# Patient Record
Sex: Female | Born: 1975
Health system: Southern US, Community
[De-identification: ages and names within clinical notes are randomized; demographics above are authoritative.]

## PROBLEM LIST (undated history)

## (undated) DIAGNOSIS — K900 Celiac disease: Secondary | ICD-10-CM

## (undated) DIAGNOSIS — D689 Coagulation defect, unspecified: Secondary | ICD-10-CM

## (undated) DIAGNOSIS — K219 Gastro-esophageal reflux disease without esophagitis: Secondary | ICD-10-CM

## (undated) DIAGNOSIS — M1990 Unspecified osteoarthritis, unspecified site: Secondary | ICD-10-CM

## (undated) DIAGNOSIS — K9041 Non-celiac gluten sensitivity: Secondary | ICD-10-CM

## (undated) DIAGNOSIS — N200 Calculus of kidney: Secondary | ICD-10-CM

## (undated) DIAGNOSIS — K3184 Gastroparesis: Secondary | ICD-10-CM

## (undated) DIAGNOSIS — J342 Deviated nasal septum: Secondary | ICD-10-CM

## (undated) HISTORY — DX: Celiac disease: K90.0

## (undated) HISTORY — PX: CHOLECYSTECTOMY: SHX55

## (undated) HISTORY — DX: Gastro-esophageal reflux disease without esophagitis: K21.9

## (undated) HISTORY — PX: WISDOM TOOTH EXTRACTION: SHX21

## (undated) HISTORY — DX: Deviated nasal septum: J34.2

## (undated) HISTORY — DX: Non-celiac gluten sensitivity: K90.41

## (undated) HISTORY — PX: MASTOIDECTOMY: SHX711

## (undated) HISTORY — DX: Unspecified osteoarthritis, unspecified site: M19.90

## (undated) HISTORY — DX: Coagulation defect, unspecified: D68.9

## (undated) HISTORY — PX: OTHER SURGICAL HISTORY: SHX169

## (undated) HISTORY — DX: Gastroparesis: K31.84

## (undated) HISTORY — DX: Calculus of kidney: N20.0

---

## 2017-08-04 HISTORY — PX: COLONOSCOPY: SHX174

## 2017-08-04 HISTORY — PX: POLYPECTOMY: SHX149

## 2017-12-24 ENCOUNTER — Encounter: Payer: Self-pay | Admitting: Gastroenterology

## 2018-02-23 ENCOUNTER — Telehealth: Payer: Self-pay | Admitting: Gastroenterology

## 2018-02-23 NOTE — Telephone Encounter (Signed)
ROI fax to [facility] for records.

## 2018-02-24 NOTE — Telephone Encounter (Signed)
rec'd from [facility] forwarded 17 pages to Dr. Harl Bowie

## 2018-02-26 ENCOUNTER — Ambulatory Visit: Payer: Managed Care, Other (non HMO) | Admitting: Gastroenterology

## 2018-02-26 ENCOUNTER — Other Ambulatory Visit: Payer: Managed Care, Other (non HMO)

## 2018-02-26 ENCOUNTER — Encounter: Payer: Self-pay | Admitting: *Deleted

## 2018-02-26 VITALS — BP 102/64 | HR 63 | Ht 64.5 in | Wt 136.0 lb

## 2018-02-26 DIAGNOSIS — K625 Hemorrhage of anus and rectum: Secondary | ICD-10-CM | POA: Diagnosis not present

## 2018-02-26 DIAGNOSIS — K582 Mixed irritable bowel syndrome: Secondary | ICD-10-CM

## 2018-02-26 DIAGNOSIS — R140 Abdominal distension (gaseous): Secondary | ICD-10-CM

## 2018-02-26 DIAGNOSIS — K219 Gastro-esophageal reflux disease without esophagitis: Secondary | ICD-10-CM

## 2018-02-26 DIAGNOSIS — E739 Lactose intolerance, unspecified: Secondary | ICD-10-CM

## 2018-02-26 DIAGNOSIS — K900 Celiac disease: Secondary | ICD-10-CM

## 2018-02-26 DIAGNOSIS — K589 Irritable bowel syndrome without diarrhea: Secondary | ICD-10-CM

## 2018-02-26 MED ORDER — NA SULFATE-K SULFATE-MG SULF 17.5-3.13-1.6 GM/177ML PO SOLN: ORAL | 0 refills | Status: DC

## 2018-02-26 NOTE — Progress Notes (Signed)
Julie Meyer    [id number]    [birth year]  Primary Care Physician:Taavon, Delfino Lovett, MD  Referring Physician: No referring provider defined for this encounter.  Chief complaint: Constipation, blood in stool, bright red blood per rectum, GERD, irritable bowel syndrome  HPI: 42 year old female with history of chronic irritable bowel syndrome previously followed by [facility] is here to establish care.  Patiently recently moved to [place].  She has had extensive GI work-up including celiac panel showed elevated Deaminated gliadin antibody to IgA 10.5 (Normal <0.2), EGD with duodenal biopsies was normal.  CRP less than 0.5.  Had allergen food panel IgE level negative.  She was empirically treated for small intestinal bacterial overgrowth, negative fructose hydrogen breath test, developed oral thrush, treated with Diflucan with no significant improvement.  She was given clotrimazole by her dentist with improvement of symptoms.  Gastric emptying scan November 16, 2017 showed normal emptying at 4 hours (emptying was 22% at 1 hour, 38% 2 hours and 91% at 4 hours)  Patient states she was constipated 2 month ago, currently on Miralax.  She was having terrible gas this week, consumed more lactose/dairy products.  Also complaining of lower abdominal pain last month but currently has no pain. Patient's symptoms are all over the place and she moves from one topic to another, extremely anxious and states she is under tremendous stress.  Chronic GERD, reflux is better  Rectal bleeding intermittent for past 1-2 months, small volume BRBPR  Colonoscopy about 10 years ago, was normal per patient report is not available  Maternal aunt diagnosed with colon cancer  Intussustion and cholecystectomy last year  Outpatient Encounter Medications as of 02/26/2018  Medication Sig  . Al Hyd-Mg Tr-Alg Ac-Sod Bicarb (GAVISCON-2 PO) Take 1 capsule by mouth as needed.  . Lactobacillus  Rhamnosus, GG, (CULTURELLE PO) Take 1 capsule by mouth daily.  Marland Kitchen omeprazole (PRILOSEC) 20 MG capsule Take 20 mg by mouth 2 (two) times daily before a meal.  . polyethylene glycol (MIRALAX / GLYCOLAX) packet Take 17 g by mouth daily.   No facility-administered encounter medications on file as of 02/26/2018.     Allergies as of 02/26/2018 - Review Complete 02/26/2018  Allergen Reaction Noted  . Estrogens  02/26/2018    Past Medical History:  Diagnosis Date  . Arthritis   . Gastroparesis   . GERD (gastroesophageal reflux disease)   . Nephrolithiasis     Past Surgical History:  Procedure Laterality Date  . CHOLECYSTECTOMY    . ear reconstructive     age 59  . MASTOIDECTOMY     age 33    Family History  Problem Relation Age of Onset  . Diabetes Father   . Uterine cancer Maternal Grandmother   . Colon cancer Maternal Aunt     Social History   Socioeconomic History  . Marital status: Married    Spouse name: Not on file  . Number of children: 2  . Years of education: Not on file  . Highest education level: Not on file  Occupational History  . Occupation: Pharmacist, hospital  Social Needs  . Financial resource strain: Not on file  . Food insecurity:    Worry: Not on file    Inability: Not on file  . Transportation needs:    Medical: Not on file    Non-medical: Not on file  Tobacco Use  . Smoking status: Never Smoker  . Smokeless tobacco: Never Used  Substance and Sexual Activity  . Alcohol use: Yes  . Drug use: Never  . Sexual activity: Not on file  Lifestyle  . Physical activity:    Days per week: Not on file    Minutes per session: Not on file  . Stress: Not on file  Relationships  . Social connections:    Talks on phone: Not on file    Gets together: Not on file    Attends religious service: Not on file    Active member of club or organization: Not on file    Attends meetings of clubs or organizations: Not on file    Relationship status: Not on file  . Intimate  partner violence:    Fear of current or ex partner: Not on file    Emotionally abused: Not on file    Physically abused: Not on file    Forced sexual activity: Not on file  Other Topics Concern  . Not on file  Social History Narrative  . Not on file      Review of systems: Review of Systems  Constitutional: Negative for fever and chills.  HENT: Positive for sore throat Eyes: Negative for blurred vision.  Respiratory: Negative for cough, shortness of breath and wheezing.   Cardiovascular: Negative for chest pain and palpitations.  Gastrointestinal: as per HPI Genitourinary: Negative for dysuria, urgency, frequency and hematuria.  Musculoskeletal: Negative for myalgias, back pain and joint pain.  Skin: Negative for itching and rash.  Neurological: Negative for dizziness, tremors, focal weakness, seizures and loss of consciousness.  Endo/Heme/Allergies: Negative for seasonal allergies.  Psychiatric/Behavioral: Negative for depression, suicidal ideas and hallucinations.  All other systems reviewed and are negative.   Physical Exam: Vitals:   02/26/18 0932  BP: 102/64  Pulse: 63   Body mass index is 22.98 kg/m. Gen:      No acute distress HEENT:  EOMI, sclera anicteric Neck:     No masses; no thyromegaly Lungs:    Clear to auscultation bilaterally; normal respiratory effort CV:         Regular rate and rhythm; no murmurs Abd:      + bowel sounds; soft, non-tender; no palpable masses, no distension Ext:    No edema; adequate peripheral perfusion Skin:      Warm and dry; no rash Neuro: alert and oriented x 3 Psych: normal mood and affect  Data Reviewed:  Reviewed labs, radiology imaging, old records and pertinent past GI work up   Assessment and Plan/Recommendations:  42 year old female with history of GERD and chronic irritable bowel syndrome here with complaints of small-volume bright red blood per rectum Most likely etiology of rectal bleeding is internal  hemorrhoids but cannot exclude neoplastic lesion Patient is extremely anxious We will proceed with colonoscopy for evaluation The risks and benefits as well as alternatives of endoscopic procedure(s) have been discussed and reviewed. All questions answered. The patient agrees to proceed.  GERD: Stable symptoms Continue antireflux measures Omeprazole 20 mg daily, 30 minutes before breakfast  Abdominal bloating and cramping Advised lactose-free diet Also provided information regarding low FODMAP diet  Irritable bowel syndrome with alternating constipation and diarrhea Discussed the dietary modifications Increase fluid intake Probiotic align 1 capsule daily  Celiac disease: Patient had elevated antibody to anti-gliadin antibody IgA but small bowel biopsies were negative We will recheck celiac panel also request HLA DQ 2 DQ 8 testing She is currently not restricting gluten in her diet. Discussed with patient in detail that if she  does have celiac then she needs to strictly adhere to gluten-free diet  No evidence of gastroparesis based on gastric emptying scan, no further testing is recommended.   Damaris Hippo , MD [phone]    CC: No ref. provider found

## 2018-02-26 NOTE — Patient Instructions (Addendum)
You have been scheduled for a colonoscopy. Please follow written instructions given to you at your visit today.  Please pick up your prep supplies at the pharmacy within the next 1-3 days. If you use inhalers (even only as needed), please bring them with you on the day of your procedure. Your physician has requested that you go to www.startemmi.com and enter the access code given to you at your visit today. This web site gives a general overview about your procedure. However, you should still follow specific instructions given to you by our office regarding your preparation for the procedure.  Go to the basement for labs today  [email or web address]  Take 1 Align capsule daily  Use IBGard 1 capsule three times a day as needed  Thank you for choosing [facility]  Kavitha Nandigam,MD

## 2018-03-01 ENCOUNTER — Encounter: Payer: Self-pay | Admitting: Gastroenterology

## 2018-03-03 LAB — GLIADIN ANTIBODIES, SERUM
Gliadin IgA: 5 Units
Gliadin IgG: 2 Units

## 2018-03-03 LAB — TISSUE TRANSGLUTAMINASE, IGA: (tTG) Ab, IgA: 1 U/mL

## 2018-03-03 LAB — CELIAC DISEASE HLA DQ ASSOC.
DQ2 (DQA1 0501/0505,DQB1 02XX): POSITIVE
DQ8 (DQA1 03XX, DQB1 0302): NEGATIVE

## 2018-03-03 LAB — RETICULIN ANTIBODIES, IGA W TITER: Reticulin IGA Screen: NEGATIVE

## 2018-03-05 ENCOUNTER — Telehealth: Payer: Self-pay | Admitting: Gastroenterology

## 2018-03-05 NOTE — Telephone Encounter (Signed)
I spoke with her.  She is concerned about what might happen if she "occasionally" had a gluten food.  She had not been gluten free up till this point so she should be fine.

## 2018-03-12 ENCOUNTER — Encounter: Payer: Self-pay | Admitting: Gastroenterology

## 2018-03-12 ENCOUNTER — Ambulatory Visit (AMBULATORY_SURGERY_CENTER): Payer: Managed Care, Other (non HMO) | Admitting: Gastroenterology

## 2018-03-12 VITALS — BP 107/68 | HR 69 | Temp 99.3°F | Resp 24 | Ht 64.0 in | Wt 136.0 lb

## 2018-03-12 DIAGNOSIS — D122 Benign neoplasm of ascending colon: Secondary | ICD-10-CM | POA: Diagnosis present

## 2018-03-12 DIAGNOSIS — K625 Hemorrhage of anus and rectum: Secondary | ICD-10-CM

## 2018-03-12 DIAGNOSIS — K648 Other hemorrhoids: Secondary | ICD-10-CM | POA: Diagnosis not present

## 2018-03-12 DIAGNOSIS — D123 Benign neoplasm of transverse colon: Secondary | ICD-10-CM

## 2018-03-12 MED ORDER — SODIUM CHLORIDE 0.9 % IV SOLN: 500.0000 mL | Freq: Once | INTRAVENOUS | Status: DC

## 2018-03-12 NOTE — Patient Instructions (Signed)
  Please read handouts on polyps and hemorrhoids. Continue present medications. Return to GI clinic as needed.    YOU HAD AN ENDOSCOPIC PROCEDURE TODAY AT [facility]:   Refer to the procedure report that was given to you for any specific questions about what was found during the examination.  If the procedure report does not answer your questions, please call your gastroenterologist to clarify.  If you requested that your care partner not be given the details of your procedure findings, then the procedure report has been included in a sealed envelope for you to review at your convenience later.  YOU SHOULD EXPECT: Some feelings of bloating in the abdomen. Passage of more gas than usual.  Walking can help get rid of the air that was put into your GI tract during the procedure and reduce the bloating. If you had a lower endoscopy (such as a colonoscopy or flexible sigmoidoscopy) you may notice spotting of blood in your stool or on the toilet paper. If you underwent a bowel prep for your procedure, you may not have a normal bowel movement for a few days.  Please Note:  You might notice some irritation and congestion in your nose or some drainage.  This is from the oxygen used during your procedure.  There is no need for concern and it should clear up in a day or so.  SYMPTOMS TO REPORT IMMEDIATELY:   Following lower endoscopy (colonoscopy or flexible sigmoidoscopy):  Excessive amounts of blood in the stool  Significant tenderness or worsening of abdominal pains  Swelling of the abdomen that is new, acute  Fever of 100F or higher   For urgent or emergent issues, a gastroenterologist can be reached at any hour by calling [phone].   DIET:  We do recommend a small meal at first, but then you may proceed to your regular diet.  Drink plenty of fluids but you should avoid alcoholic beverages for 24 hours.  ACTIVITY:  You should plan to take it easy for the rest of today and  you should NOT DRIVE or use heavy machinery until tomorrow (because of the sedation medicines used during the test).    FOLLOW UP: Our staff will call the number listed on your records the next business day following your procedure to check on you and address any questions or concerns that you may have regarding the information given to you following your procedure. If we do not reach you, we will leave a message.  However, if you are feeling well and you are not experiencing any problems, there is no need to return our call.  We will assume that you have returned to your regular daily activities without incident.  If any biopsies were taken you will be contacted by phone or by letter within the next 1-3 weeks.  Please call us at [phone] if you have not heard about the biopsies in 3 weeks.    SIGNATURES/CONFIDENTIALITY: You and/or your care partner have signed paperwork which will be entered into your electronic medical record.  These signatures attest to the fact that that the information above on your After Visit Summary has been reviewed and is understood.  Full responsibility of the confidentiality of this discharge information lies with you and/or your care-partner.

## 2018-03-12 NOTE — Progress Notes (Signed)
Called to room to assist during endoscopic procedure.  Patient ID and intended procedure confirmed with present staff. Received instructions for my participation in the procedure from the performing physician.  

## 2018-03-12 NOTE — Op Note (Signed)
[facility] Patient Name: Julie Meyer Procedure Date: 03/12/2018 10:54 AM MRN: [id number] Endoscopist: Mauri Pole , MD Age: 43 Referring MD:  Date of Birth: Jan 06, 1976 Gender: Female Account #: [id number] Procedure:                Colonoscopy Indications:              Evaluation of unexplained GI bleeding Medicines:                Monitored Anesthesia Care Procedure:                Pre-Anesthesia Assessment:                           - Prior to the procedure, a History and Physical                            was performed, and patient medications and                            allergies were reviewed. The patient's tolerance of                            previous anesthesia was also reviewed. The risks                            and benefits of the procedure and the sedation                            options and risks were discussed with the patient.                            All questions were answered, and informed consent                            was obtained. Prior Anticoagulants: The patient has                            taken no previous anticoagulant or antiplatelet                            agents. ASA Grade Assessment: II - A patient with                            mild systemic disease. After reviewing the risks                            and benefits, the patient was deemed in                            satisfactory condition to undergo the procedure.                           After obtaining informed consent, the colonoscope  was passed under direct vision. Throughout the                            procedure, the patient's blood pressure, pulse, and                            oxygen saturations were monitored continuously. The                            Model PCF-[id number]) scope was introduced                            through the anus and advanced to the the cecum,                            identified by  appendiceal orifice and ileocecal                            valve. The colonoscopy was performed without                            difficulty. The patient tolerated the procedure                            well. The quality of the bowel preparation was                            excellent. The ileocecal valve, appendiceal                            orifice, and rectum were photographed. Scope In: 11:06:41 AM Scope Out: 11:21:52 AM Scope Withdrawal Time: 0 hours 9 minutes 3 seconds  Total Procedure Duration: 0 hours 15 minutes 11 seconds  Findings:                 The perianal and digital rectal examinations were                            normal.                           A 9 mm polyp was found in the ascending colon. The                            polyp was flat. The polyp was removed with a cold                            snare. Resection and retrieval were complete.                           A 1 mm polyp was found in the transverse colon. The                            polyp was sessile. The polyp was removed  with a                            cold biopsy forceps. Resection and retrieval were                            complete.                           Non-bleeding internal hemorrhoids were found during                            retroflexion. The hemorrhoids were medium-sized.                           The exam was otherwise without abnormality. Complications:            No immediate complications. Estimated Blood Loss:     Estimated blood loss was minimal. Impression:               - One 9 mm polyp in the ascending colon, removed                            with a cold snare. Resected and retrieved.                           - One 1 mm polyp in the transverse colon, removed                            with a cold biopsy forceps. Resected and retrieved.                           - Non-bleeding internal hemorrhoids.                           - The examination was otherwise  normal. Recommendation:           - Patient has a contact number available for                            emergencies. The signs and symptoms of potential                            delayed complications were discussed with the                            patient. Return to normal activities tomorrow.                            Written discharge instructions were provided to the                            patient.                           - Resume previous diet.                           -  Continue present medications.                           - Await pathology results.                           - Repeat colonoscopy date to be determined after                            pending pathology results are reviewed for                            surveillance based on pathology results.                           - Return to GI clinic PRN. Mauri Pole, MD 03/12/2018 11:29:06 AM This report has been signed electronically.

## 2018-03-15 ENCOUNTER — Telehealth: Payer: Self-pay

## 2018-03-15 NOTE — Telephone Encounter (Signed)
  Follow up Call-  Call back number 03/12/2018  Post procedure Call Back phone  # [phone]  Permission to leave phone message Yes     Patient questions:  Do you have a fever, pain , or abdominal swelling? No. Pain Score  0 *  Have you tolerated food without any problems? Yes.    Have you been able to return to your normal activities? Yes.    Do you have any questions about your discharge instructions: Diet   No. Medications  No. Follow up visit  No.  Do you have questions or concerns about your Care? No.  Actions: * If pain score is 4 or above: No action needed, pain <4.

## 2018-03-17 ENCOUNTER — Other Ambulatory Visit: Payer: Self-pay

## 2018-03-18 ENCOUNTER — Encounter: Payer: Self-pay | Admitting: Gastroenterology

## 2018-03-25 ENCOUNTER — Telehealth: Payer: Self-pay | Admitting: Gastroenterology

## 2018-03-25 NOTE — Telephone Encounter (Signed)
Patient states Dr.Nandigam put her on a gluten free diet but her acid reflux medication omeprazole has gluten in it. Patient is wanting to know if there is some form of this medicine for a similar one that is gluten free.

## 2018-03-25 NOTE — Telephone Encounter (Signed)
Can you please check if alternative PPI, Protonix or lansoprazole are gluten-free or not.  Will consider switching to ranitidine 150 mg twice daily if all PPI have gluten in the formulary

## 2018-03-25 NOTE — Telephone Encounter (Signed)
Left a message for the patient.

## 2018-04-07 NOTE — Telephone Encounter (Signed)
Discussed with the patient. She has not had any GI distress while continuing to take Omeprazole. She chooses to continue it at this time. Also states she has been on Ranitidine in the past and did not find it as effective as Omeprazole.

## 2018-05-25 ENCOUNTER — Encounter: Payer: Self-pay | Admitting: Gastroenterology

## 2018-05-25 ENCOUNTER — Ambulatory Visit: Payer: Managed Care, Other (non HMO) | Admitting: Gastroenterology

## 2018-05-25 VITALS — BP 90/64 | HR 68 | Ht 64.0 in | Wt 143.6 lb

## 2018-05-25 DIAGNOSIS — K5909 Other constipation: Secondary | ICD-10-CM | POA: Diagnosis not present

## 2018-05-25 DIAGNOSIS — R1084 Generalized abdominal pain: Secondary | ICD-10-CM

## 2018-05-25 DIAGNOSIS — K900 Celiac disease: Secondary | ICD-10-CM | POA: Diagnosis not present

## 2018-05-25 DIAGNOSIS — R109 Unspecified abdominal pain: Secondary | ICD-10-CM

## 2018-05-25 DIAGNOSIS — R140 Abdominal distension (gaseous): Secondary | ICD-10-CM | POA: Diagnosis not present

## 2018-05-25 NOTE — Patient Instructions (Signed)
You have been given a testing kit Glucose breath test, which is completed by a company named [facility]. Make sure to return your test in the mail using the return mailing label given you along with the kit. Your demographic and insurance information have already been sent to the company and they should be in contact with you over the next week regarding this test. Please keep in mind that you will be getting a call from phone number [phone] or a similar number. If you do not hear from them within this time frame, please call our office at [phone].   Continue Miralax 1 capsful daily and titrate as needed  Take IBGard 1 capsule three times a day as needed    Gluten-Free Diet for Celiac Disease, Adult The gluten-free diet includes all foods that do not contain gluten. Gluten is a protein that is found in wheat, rye, barley, and some other grains. Following the gluten-free diet is the only treatment for people with celiac disease. It helps to prevent damage to the intestines and improves or eliminates the symptoms of celiac disease. Following the gluten-free diet requires some planning. It can be challenging at first, but it gets easier with time and practice. There are more gluten-free options available today than ever before. If you need help finding gluten-free foods or if you have questions, talk with your diet and nutrition specialist (registered dietitian) or your health care provider. What do I need to know about a gluten-free diet?  All fruits, vegetables, and meats are safe to eat and do not contain gluten.  When grocery shopping, start by shopping in the produce, meat, and dairy sections. These sections are more likely to contain gluten-free foods. Then move to the aisles that contain packaged foods if you need to.  Read all food labels. Gluten is often added to foods. Always check the ingredient list and look for warnings, such as "may contain gluten."  Talk with your  dietitian or health care provider before taking a gluten-free multivitamin or mineral supplement.  Be aware of gluten-free foods having contact with foods that contain gluten (cross-contamination). This can happen at home and with any processed foods. ? Talk with your health care provider or dietitian about how to reduce the risk of cross-contamination in your home. ? If you have questions about how a food is processed, ask the manufacturer. What key words help to identify gluten? Foods that list any of these key words on the label usually contain gluten:  Wheat, flour, enriched flour, bromated flour, white flour, durum flour, graham flour, phosphated flour, self-rising flour, semolina, farina, barley (malt), rye, and oats.  Starch, dextrin, modified food starch, or cereal.  Thickening, fillers, or emulsifiers.  Malt flavoring, malt extract, or malt syrup.  Hydrolyzed vegetable protein.  In the [place] packaged foods that are gluten-free are required to be labeled "GF." These foods should be easy to identify and are safe to eat. In the [place] food companies are also required to list common food allergens, including wheat, on their labels. Recommended foods Grains  Amaranth, bean flours, 100% buckwheat flour, corn, millet, nut flours or nut meals, GF oats, quinoa, rice, sorghum, teff, rice wafers, pure cornmeal tortillas, popcorn, and hot cereals made from cornmeal. Hominy, rice, wild rice. Some Asian rice noodles or bean noodles. Arrowroot starch, corn bran, corn flour, corn germ, cornmeal, corn starch, potato flour, potato starch flour, and rice bran. Plain, brown, and sweet rice flours. Rice polish, soy flour, and tapioca starch.  Vegetables  All plain fresh, frozen, and canned vegetables. Fruits  All plain fresh, frozen, canned, and dried fruits, and 100% fruit juices. Meats and other protein foods  All fresh beef, pork, poultry, fish, seafood, and eggs. Fish canned in water, oil,  brine, or vegetable broth. Plain nuts and seeds, peanut butter. Some lunch meat and some frankfurters. Dried beans, dried peas, and lentils. Dairy  Fresh plain, dry, evaporated, or condensed milk. Cream, butter, sour cream, whipping cream, and most yogurts. Unprocessed cheese, most processed cheeses, some cottage cheese, some cream cheeses. Beverages  Coffee, tea, most herbal teas. Carbonated beverages and some root beers. Wine, sake, and distilled spirits, such as gin, vodka, and whiskey. Most hard ciders. Fats and oils  Butter, margarine, vegetable oil, hydrogenated butter, olive oil, shortening, lard, cream, and some mayonnaise. Some commercial salad dressings. Olives. Sweets and desserts  Sugar, honey, some syrups, molasses, jelly, and jam. Plain hard candy, marshmallows, and gumdrops. Pure cocoa powder. Plain chocolate. Custard and some pudding mixes. Gelatin desserts, sorbets, frozen ice pops, and sherbet. Cake, cookies, and other desserts prepared with allowed flours. Some commercial ice creams. Cornstarch, tapioca, and rice puddings. Seasoning and other foods  Some canned or frozen soups. Monosodium glutamate (MSG). Cider, rice, and wine vinegar. Baking soda and baking powder. Cream of tartar. Baking and nutritional yeast. Certain soy sauces made without wheat (ask your dietitian about specific brands that are allowed). Nuts, coconut, and chocolate. Salt, pepper, herbs, spices, flavoring extracts, imitation or artificial flavorings, natural flavorings, and food colorings. Some medicines and supplements. Some lip glosses and other cosmetics. Rice syrups. The items listed may not be a complete list. Talk with your dietitian about what dietary choices are best for you. Foods to avoid Grains  Barley, bran, bulgur, couscous, cracked wheat, Fifty-Six, farro, graham, malt, matzo, semolina, wheat germ, and all wheat and rye cereals including spelt and kamut. Cereals containing malt as a flavoring,  such as rice cereal. Noodles, spaghetti, macaroni, most packaged rice mixes, and all mixes containing wheat, rye, barley, or triticale. Vegetables  Most creamed vegetables and most vegetables canned in sauces. Some commercially prepared vegetables and salads. Fruits  Thickened or prepared fruits and some pie fillings. Some fruit snacks and fruit roll-ups. Meats and other protein foods  Any meat or meat alternative containing wheat, rye, barley, or gluten stabilizers. These are often marinated or packaged meats and lunch meats. Bread-containing products, such as Swiss steak, croquettes, meatballs, and meatloaf. Most tuna canned in vegetable broth and [place] with hydrolyzed vegetable protein (HVP) injected as part of the basting. Seitan. Imitation fish. Eggs in sauces made from ingredients to avoid. Dairy  Commercial chocolate milk drinks and malted milk. Some non-dairy creamers. Any cheese product containing ingredients to avoid. Beverages  Certain cereal beverages. Beer, ale, malted milk, and some root beers. Some hard ciders. Some instant flavored coffees. Some herbal teas made with barley or with barley malt added. Fats and oils  Some commercial salad dressings. Sour cream containing modified food starch. Sweets and desserts  Some toffees. Chocolate-coated nuts (may be rolled in wheat flour) and some commercial candies and candy bars. Most cakes, cookies, donuts, pastries, and other baked goods. Some commercial ice cream. Ice cream cones. Commercially prepared mixes for cakes, cookies, and other desserts. Bread pudding and other puddings thickened with flour. Products containing brown rice syrup made with barley malt enzyme. Desserts and sweets made with malt flavoring. Seasoning and other foods  Some curry powders, some dry seasoning mixes,  some gravy extracts, some meat sauces, some ketchups, some prepared mustards, and horseradish. Certain soy sauces. Malt vinegar. Bouillon and bouillon  cubes that contain HVP. Some chip dips, and some chewing gum. Yeast extract. Brewer's yeast. Caramel color. Some medicines and supplements. Some lip glosses and other cosmetics. The items listed may not be a complete list. Talk with your dietitian about what dietary choices are best for you. Summary  Gluten is a protein that is found in wheat, rye, barley, and some other grains. The gluten-free diet includes all foods that do not contain gluten.  If you need help finding gluten-free foods or if you have questions, talk with your diet and nutrition specialist (registered dietitian) or your health care provider.  Read all food labels. Gluten is often added to foods. Always check the ingredient list and look for warnings, such as "may contain gluten." This information is not intended to replace advice given to you by your health care provider. Make sure you discuss any questions you have with your health care provider. Document Released: 07/21/2005 Document Revised: 05/05/2016 Document Reviewed: 05/05/2016 Elsevier Interactive Patient Education  2018 [employer].

## 2018-05-25 NOTE — Progress Notes (Addendum)
Julie Meyer    [id number]    10-28-1975  Primary Care Physician:Taavon, Delfino Lovett, MD  Referring Physician: Brien Few, MD [place], [place] [address]  Chief complaint: Bloating, lower abdominal cramps, constipation  HPI: 42 year old Caucasian female with history of chronic irritable bowel syndrome, chronic abdominal bloating associated with lower abdominal cramps She is having irregular bowel habits with bowel movement once every 3 to 4 days.  Recently restarted taking MiraLAX daily Her mother has endometriosis and she was informed by her gynecologist that it is possible she may have endometriosis, may need ex lap but patient wants to hold off at this point.  Elevated deaminated gliadin antibody to IgA in [place] repeat celiac panel negative here but HLA DQ2 positive. She is trying to follow a gluten-free diet.  She reports improvement of skin rash and oral ulcers since she stopped eating gluten.  Had severe lower abdominal cramps after she ate pecans.  Is also trying to avoid high FODMAPs.  She is no longer having rectal bleeding, trying to avoid excessive straining during defecation. Colonoscopy March 12, 2018 removal of sessile serrated adenoma and tubular adenoma.  Internal hemorrhoids otherwise normal exam   Outpatient Encounter Medications as of 05/25/2018  Medication Sig  . Al Hyd-Mg Tr-Alg Ac-Sod Bicarb (GAVISCON-2 PO) Take 1 capsule by mouth as needed.  . Lactobacillus Rhamnosus, GG, (CULTURELLE PO) Take 1 capsule by mouth daily.  Marland Kitchen omeprazole (PRILOSEC) 20 MG capsule Take 20 mg by mouth 2 (two) times daily before a meal.  . Peppermint Oil (IBGARD) 90 MG CPCR Take 2 capsules by mouth daily.  . polyethylene glycol (MIRALAX / GLYCOLAX) packet Take 17 g by mouth daily.   No facility-administered encounter medications on file as of 05/25/2018.     Allergies as of 05/25/2018 - Review Complete 05/25/2018  Allergen Reaction Noted    . Estrogens  02/26/2018    Past Medical History:  Diagnosis Date  . Arthritis   . Clotting disorder ([place])    Has a venous malformation in left foot which causes superficial clots  . Gastroparesis   . GERD (gastroesophageal reflux disease)   . Nephrolithiasis     Past Surgical History:  Procedure Laterality Date  . CHOLECYSTECTOMY    . ear reconstructive     age 29  . MASTOIDECTOMY     age 66    Family History  Problem Relation Age of Onset  . Diabetes Father   . Uterine cancer Maternal Grandmother   . Colon cancer Maternal Aunt     Social History   Socioeconomic History  . Marital status: Married    Spouse name: Not on file  . Number of children: 2  . Years of education: Not on file  . Highest education level: Not on file  Occupational History  . Occupation: Pharmacist, hospital  Social Needs  . Financial resource strain: Not on file  . Food insecurity:    Worry: Not on file    Inability: Not on file  . Transportation needs:    Medical: Not on file    Non-medical: Not on file  Tobacco Use  . Smoking status: Never Smoker  . Smokeless tobacco: Never Used  Substance and Sexual Activity  . Alcohol use: Yes  . Drug use: Never  . Sexual activity: Not on file  Lifestyle  . Physical activity:    Days per week: Not on file    Minutes per session:  Not on file  . Stress: Not on file  Relationships  . Social connections:    Talks on phone: Not on file    Gets together: Not on file    Attends religious service: Not on file    Active member of club or organization: Not on file    Attends meetings of clubs or organizations: Not on file    Relationship status: Not on file  . Intimate partner violence:    Fear of current or ex partner: Not on file    Emotionally abused: Not on file    Physically abused: Not on file    Forced sexual activity: Not on file  Other Topics Concern  . Not on file  Social History Narrative  . Not on file      Review of systems: Review of  Systems  Constitutional: Negative for fever and chills.  HENT: Negative.   Eyes: Negative for blurred vision.  Respiratory: Negative for cough, shortness of breath and wheezing.   Cardiovascular: Negative for chest pain and palpitations.  Gastrointestinal: as per HPI Genitourinary: Negative for dysuria, urgency, frequency and hematuria.  Musculoskeletal: Negative for myalgias, back pain and joint pain.  Skin: Negative for itching and rash.  Neurological: Negative for dizziness, tremors, focal weakness, seizures and loss of consciousness.  Endo/Heme/Allergies: Negative for seasonal allergies.  Psychiatric/Behavioral: Negative for depression, suicidal ideas and hallucinations.  All other systems reviewed and are negative.   Physical Exam: Vitals:   05/25/18 0853  BP: 90/64  Pulse: 68   Body mass index is 24.65 kg/m. Gen:      No acute distress HEENT:  EOMI, sclera anicteric Neck:     No masses; no thyromegaly Lungs:    Clear to auscultation bilaterally; normal respiratory effort CV:         Regular rate and rhythm; no murmurs Abd:      + bowel sounds; soft, non-tender; no palpable masses, no distension Ext:    No edema; adequate peripheral perfusion Skin:      Warm and dry; no rash Neuro: alert and oriented x 3 Psych: normal mood and affect  Data Reviewed:  Reviewed labs, radiology imaging, old records and pertinent past GI work up   Assessment and Plan/Recommendations: 42-year-old female with celiac disease, persistent abdominal bloating and cramping despite falling gluten-free diet Advised patient to look for possible cross-contamination during manufacturing process and accidental gluten ingestion.  Continue to follow strictly gluten-free diet  Constipation: Relax 1 capful daily and titrate up as needed to have daily soft BM Increase dietary fiber and fluid intake  Excessive bloating and abdominal cramping: Will request glucose breath test to exclude small intestinal  bacterial overgrowth Use IBgard 1 capsule up to 3 times daily as needed  Greater than 50% of the time used for counseling as well as treatment plan and follow-up. She had multiple questions which were answered to her satisfaction  K. Veena Nandigam , MD [phone]    CC: Taavon, Richard, MD   

## 2018-05-26 ENCOUNTER — Encounter: Payer: Self-pay | Admitting: Gastroenterology

## 2018-06-01 ENCOUNTER — Telehealth: Payer: Self-pay | Admitting: Gastroenterology

## 2018-06-01 NOTE — Telephone Encounter (Signed)
Pt states that she had diarrhea and fever the day after her breath test, they resolved but now she has a terrible upper stomach pain, she wants to know what she can take and if they are related to breath test.

## 2018-06-02 NOTE — Telephone Encounter (Signed)
Ok

## 2018-06-02 NOTE — Telephone Encounter (Signed)
Symptoms have improved. She started with diarrhea and fever after she had done the breath test. This resolved. She then had soreness in her abdomen that was improved with warmth from her heating pad. She has Carafate from previous issues she has been taking. She will let us know if she fails to improve or acutely worsens.  Encouraged to follow the plan as discussed in the office visit.

## 2018-06-04 ENCOUNTER — Telehealth: Payer: Self-pay | Admitting: Gastroenterology

## 2018-06-04 NOTE — Telephone Encounter (Signed)
Lab needs ICD 10 and signature of MD for SIBO order.

## 2018-06-07 NOTE — Telephone Encounter (Signed)
Called back to the lab. ICD-10 given as from the recent office visit. R14.0 /  R10.84 /  R10.9

## 2018-06-08 ENCOUNTER — Telehealth: Payer: Self-pay | Admitting: Gastroenterology

## 2018-06-08 NOTE — Telephone Encounter (Signed)
Done  Sent back with Dr Jillyn Hidden signiture

## 2018-06-08 NOTE — Telephone Encounter (Signed)
The form will be faxed back to [place] for the signature. I think it will come to your fax machine. It is the breath test given to her at the office visit. I have given the ICD-10 codes to them already. Just need the signature.  Thanks

## 2018-06-08 NOTE — Telephone Encounter (Signed)
Raphael from lab calling, he stated that fax that they received is missing MD's signature, pls refax it.

## 2018-06-11 ENCOUNTER — Telehealth: Payer: Self-pay | Admitting: Gastroenterology

## 2018-06-14 NOTE — Telephone Encounter (Signed)
Dr Silverio Decamp will review the results once she receives them.

## 2018-07-16 ENCOUNTER — Ambulatory Visit: Payer: Managed Care, Other (non HMO) | Admitting: Gastroenterology

## 2018-07-16 ENCOUNTER — Encounter: Payer: Self-pay | Admitting: Gastroenterology

## 2018-07-16 VITALS — BP 98/62 | HR 68 | Ht 64.5 in | Wt 142.0 lb

## 2018-07-16 DIAGNOSIS — K219 Gastro-esophageal reflux disease without esophagitis: Secondary | ICD-10-CM | POA: Diagnosis not present

## 2018-07-16 DIAGNOSIS — K900 Celiac disease: Secondary | ICD-10-CM | POA: Diagnosis not present

## 2018-07-16 DIAGNOSIS — K588 Other irritable bowel syndrome: Secondary | ICD-10-CM | POA: Diagnosis not present

## 2018-07-16 NOTE — Patient Instructions (Signed)
We have printed your lab orders for you to carry to your primary care doctor   Continue Omeprazole   Gastroesophageal Reflux Disease, Adult Normally, food travels down the esophagus and stays in the stomach to be digested. However, when a person has gastroesophageal reflux disease (GERD), food and stomach acid move back up into the esophagus. When this happens, the esophagus becomes sore and inflamed. Over time, GERD can create small holes (ulcers) in the lining of the esophagus. What are the causes? This condition is caused by a problem with the muscle between the esophagus and the stomach (lower esophageal sphincter, or LES). Normally, the LES muscle closes after food passes through the esophagus to the stomach. When the LES is weakened or abnormal, it does not close properly, and that allows food and stomach acid to go back up into the esophagus. The LES can be weakened by certain dietary substances, medicines, and medical conditions, including:  Tobacco use.  Pregnancy.  Having a hiatal hernia.  Heavy alcohol use.  Certain foods and beverages, such as coffee, chocolate, onions, and peppermint.  What increases the risk? This condition is more likely to develop in:  People who have an increased body weight.  People who have connective tissue disorders.  People who use NSAID medicines.  What are the signs or symptoms? Symptoms of this condition include:  Heartburn.  Difficult or painful swallowing.  The feeling of having a lump in the throat.  Abitter taste in the mouth.  Bad breath.  Having a large amount of saliva.  Having an upset or bloated stomach.  Belching.  Chest pain.  Shortness of breath or wheezing.  Ongoing (chronic) cough or a night-time cough.  Wearing away of tooth enamel.  Weight loss.  Different conditions can cause chest pain. Make sure to see your health care provider if you experience chest pain. How is this diagnosed? Your health care  provider will take a medical history and perform a physical exam. To determine if you have mild or severe GERD, your health care provider may also monitor how you respond to treatment. You may also have other tests, including:  An endoscopy toexamine your stomach and esophagus with a small camera.  A test thatmeasures the acidity level in your esophagus.  A test thatmeasures how much pressure is on your esophagus.  A barium swallow or modified barium swallow to show the shape, size, and functioning of your esophagus.  How is this treated? The goal of treatment is to help relieve your symptoms and to prevent complications. Treatment for this condition may vary depending on how severe your symptoms are. Your health care provider may recommend:  Changes to your diet.  Medicine.  Surgery.  Follow these instructions at home: Diet  Follow a diet as recommended by your health care provider. This may involve avoiding foods and drinks such as: ? Coffee and tea (with or without caffeine). ? Drinks that containalcohol. ? Energy drinks and sports drinks. ? Carbonated drinks or sodas. ? Chocolate and cocoa. ? Peppermint and mint flavorings. ? Garlic and onions. ? Horseradish. ? Spicy and acidic foods, including peppers, chili powder, curry powder, vinegar, hot sauces, and barbecue sauce. ? Citrus fruit juices and citrus fruits, such as oranges, lemons, and limes. ? Tomato-based foods, such as red sauce, chili, salsa, and pizza with red sauce. ? Fried and fatty foods, such as donuts, french fries, potato chips, and high-fat dressings. ? High-fat meats, such as hot dogs and fatty cuts of  red and white meats, such as rib eye steak, sausage, ham, and bacon. ? High-fat dairy items, such as whole milk, butter, and cream cheese.  Eat small, frequent meals instead of large meals.  Avoid drinking large amounts of liquid with your meals.  Avoid eating meals during the 2-3 hours before  bedtime.  Avoid lying down right after you eat.  Do not exercise right after you eat. General instructions  Pay attention to any changes in your symptoms.  Take over-the-counter and prescription medicines only as told by your health care provider. Do not take aspirin, ibuprofen, or other NSAIDs unless your health care provider told you to do so.  Do not use any tobacco products, including cigarettes, chewing tobacco, and e-cigarettes. If you need help quitting, ask your health care provider.  Wear loose-fitting clothing. Do not wear anything tight around your waist that causes pressure on your abdomen.  Raise (elevate) the head of your bed 6 inches (15cm).  Try to reduce your stress, such as with yoga or meditation. If you need help reducing stress, ask your health care provider.  If you are overweight, reduce your weight to an amount that is healthy for you. Ask your health care provider for guidance about a safe weight loss goal.  Keep all follow-up visits as told by your health care provider. This is important. Contact a health care provider if:  You have new symptoms.  You have unexplained weight loss.  You have difficulty swallowing, or it hurts to swallow.  You have wheezing or a persistent cough.  Your symptoms do not improve with treatment.  You have a hoarse voice. Get help right away if:  You have pain in your arms, neck, jaw, teeth, or back.  You feel sweaty, dizzy, or light-headed.  You have chest pain or shortness of breath.  You vomit and your vomit looks like blood or coffee grounds.  You faint.  Your stool is bloody or black.  You cannot swallow, drink, or eat. This information is not intended to replace advice given to you by your health care provider. Make sure you discuss any questions you have with your health care provider. Document Released: 04/30/2005 Document Revised: 12/19/2015 Document Reviewed: 11/15/2014 Elsevier Interactive Patient  Education  2018 [employer].   Gluten-Free Diet for Celiac Disease, Adult The gluten-free diet includes all foods that do not contain gluten. Gluten is a protein that is found in wheat, rye, barley, and some other grains. Following the gluten-free diet is the only treatment for people with celiac disease. It helps to prevent damage to the intestines and improves or eliminates the symptoms of celiac disease. Following the gluten-free diet requires some planning. It can be challenging at first, but it gets easier with time and practice. There are more gluten-free options available today than ever before. If you need help finding gluten-free foods or if you have questions, talk with your diet and nutrition specialist (registered dietitian) or your health care provider. What do I need to know about a gluten-free diet?  All fruits, vegetables, and meats are safe to eat and do not contain gluten.  When grocery shopping, start by shopping in the produce, meat, and dairy sections. These sections are more likely to contain gluten-free foods. Then move to the aisles that contain packaged foods if you need to.  Read all food labels. Gluten is often added to foods. Always check the ingredient list and look for warnings, such as "may contain gluten."  Talk  with your dietitian or health care provider before taking a gluten-free multivitamin or mineral supplement.  Be aware of gluten-free foods having contact with foods that contain gluten (cross-contamination). This can happen at home and with any processed foods. ? Talk with your health care provider or dietitian about how to reduce the risk of cross-contamination in your home. ? If you have questions about how a food is processed, ask the manufacturer. What key words help to identify gluten? Foods that list any of these key words on the label usually contain gluten:  Wheat, flour, enriched flour, bromated flour, white flour, durum flour, graham flour,  phosphated flour, self-rising flour, semolina, farina, barley (malt), rye, and oats.  Starch, dextrin, modified food starch, or cereal.  Thickening, fillers, or emulsifiers.  Malt flavoring, malt extract, or malt syrup.  Hydrolyzed vegetable protein.  In the [place] packaged foods that are gluten-free are required to be labeled "GF." These foods should be easy to identify and are safe to eat. In the [place] food companies are also required to list common food allergens, including wheat, on their labels. Recommended foods Grains  Amaranth, bean flours, 100% buckwheat flour, corn, millet, nut flours or nut meals, GF oats, quinoa, rice, sorghum, teff, rice wafers, pure cornmeal tortillas, popcorn, and hot cereals made from cornmeal. Hominy, rice, wild rice. Some Asian rice noodles or bean noodles. Arrowroot starch, corn bran, corn flour, corn germ, cornmeal, corn starch, potato flour, potato starch flour, and rice bran. Plain, brown, and sweet rice flours. Rice polish, soy flour, and tapioca starch. Vegetables  All plain fresh, frozen, and canned vegetables. Fruits  All plain fresh, frozen, canned, and dried fruits, and 100% fruit juices. Meats and other protein foods  All fresh beef, pork, poultry, fish, seafood, and eggs. Fish canned in water, oil, brine, or vegetable broth. Plain nuts and seeds, peanut butter. Some lunch meat and some frankfurters. Dried beans, dried peas, and lentils. Dairy  Fresh plain, dry, evaporated, or condensed milk. Cream, butter, sour cream, whipping cream, and most yogurts. Unprocessed cheese, most processed cheeses, some cottage cheese, some cream cheeses. Beverages  Coffee, tea, most herbal teas. Carbonated beverages and some root beers. Wine, sake, and distilled spirits, such as gin, vodka, and whiskey. Most hard ciders. Fats and oils  Butter, margarine, vegetable oil, hydrogenated butter, olive oil, shortening, lard, cream, and some mayonnaise. Some  commercial salad dressings. Olives. Sweets and desserts  Sugar, honey, some syrups, molasses, jelly, and jam. Plain hard candy, marshmallows, and gumdrops. Pure cocoa powder. Plain chocolate. Custard and some pudding mixes. Gelatin desserts, sorbets, frozen ice pops, and sherbet. Cake, cookies, and other desserts prepared with allowed flours. Some commercial ice creams. Cornstarch, tapioca, and rice puddings. Seasoning and other foods  Some canned or frozen soups. Monosodium glutamate (MSG). Cider, rice, and wine vinegar. Baking soda and baking powder. Cream of tartar. Baking and nutritional yeast. Certain soy sauces made without wheat (ask your dietitian about specific brands that are allowed). Nuts, coconut, and chocolate. Salt, pepper, herbs, spices, flavoring extracts, imitation or artificial flavorings, natural flavorings, and food colorings. Some medicines and supplements. Some lip glosses and other cosmetics. Rice syrups. The items listed may not be a complete list. Talk with your dietitian about what dietary choices are best for you. Foods to avoid Grains  Barley, bran, bulgur, couscous, cracked wheat, [place], farro, graham, malt, matzo, semolina, wheat germ, and all wheat and rye cereals including spelt and kamut. Cereals containing malt as a flavoring, such as  rice cereal. Noodles, spaghetti, macaroni, most packaged rice mixes, and all mixes containing wheat, rye, barley, or triticale. Vegetables  Most creamed vegetables and most vegetables canned in sauces. Some commercially prepared vegetables and salads. Fruits  Thickened or prepared fruits and some pie fillings. Some fruit snacks and fruit roll-ups. Meats and other protein foods  Any meat or meat alternative containing wheat, rye, barley, or gluten stabilizers. These are often marinated or packaged meats and lunch meats. Bread-containing products, such as Swiss steak, croquettes, meatballs, and meatloaf. Most tuna canned in  vegetable broth and [place] with hydrolyzed vegetable protein (HVP) injected as part of the basting. [place]itan. Imitation fish. Eggs in sauces made from ingredients to avoid. Dairy  Commercial chocolate milk drinks and malted milk. Some non-dairy creamers. Any cheese product containing ingredients to avoid. Beverages  Certain cereal beverages. Beer, ale, malted milk, and some root beers. Some hard ciders. Some instant flavored coffees. Some herbal teas made with barley or with barley malt added. Fats and oils  Some commercial salad dressings. Sour cream containing modified food starch. Sweets and desserts  Some toffees. Chocolate-coated nuts (may be rolled in wheat flour) and some commercial candies and candy bars. Most cakes, cookies, donuts, pastries, and other baked goods. Some commercial ice cream. Ice cream cones. Commercially prepared mixes for cakes, cookies, and other desserts. Bread pudding and other puddings thickened with flour. Products containing brown rice syrup made with barley malt enzyme. Desserts and sweets made with malt flavoring. Seasoning and other foods  Some curry powders, some dry seasoning mixes, some gravy extracts, some meat sauces, some ketchups, some prepared mustards, and horseradish. Certain soy sauces. Malt vinegar. Bouillon and bouillon cubes that contain HVP. Some chip dips, and some chewing gum. Yeast extract. Brewer's yeast. Caramel color. Some medicines and supplements. Some lip glosses and other cosmetics. The items listed may not be a complete list. Talk with your dietitian about what dietary choices are best for you. Summary  Gluten is a protein that is found in wheat, rye, barley, and some other grains. The gluten-free diet includes all foods that do not contain gluten.  If you need help finding gluten-free foods or if you have questions, talk with your diet and nutrition specialist (registered dietitian) or your health care provider.  Read all food  labels. Gluten is often added to foods. Always check the ingredient list and look for warnings, such as "may contain gluten." This information is not intended to replace advice given to you by your health care provider. Make sure you discuss any questions you have with your health care provider. Document Released: 07/21/2005 Document Revised: 05/05/2016 Document Reviewed: 05/05/2016 Elsevier Interactive Patient Education  2018 [employer].

## 2018-07-16 NOTE — Progress Notes (Signed)
Julie Meyer    [id number]    05/17/1976  Primary Care Physician:Taavon, Delfino Lovett, MD  Referring Physician: Brien Few, MD [address] [place], [place] [address]  Chief complaint:  Celiac disease, GERD  HPI: 42 year old female with history of celiac disease and chronic GERD here for follow-up visit. Her symptoms have improved especially abdominal discomfort since she is strictly gluten-free diet.  She is also trying to avoid cross-contamination or touching any gluten products. GERD symptoms worse when she took low-dose aspirin and ibuprofen for superficial thrombus in her foot that has venous malformation.  She started having sore throat and also white coating on tongue, has since resolved.  Patient also complaining of intermittent tingling and numbness in the left arm and also lower extremities occasionally.  Denies any dysphagia, nausea, vomiting, abdominal pain, change in bowel habits, melena or blood per rectum.  Glucose breath test: May 30, 2018 did not show any increase in hydrogen or methane level to suggest small intestinal bacterial overgrowth  Outpatient Encounter Medications as of 07/16/2018  Medication Sig  . Al Hyd-Mg Tr-Alg Ac-Sod Bicarb (GAVISCON-2 PO) Take 1 capsule by mouth as needed.  Marland Kitchen aspirin 81 MG tablet Take 81 mg by mouth as needed for pain.  Marland Kitchen ibuprofen (ADVIL,MOTRIN) 200 MG tablet Take 200 mg by mouth as needed.  . Lactobacillus Rhamnosus, GG, (CULTURELLE PO) Take 1 capsule by mouth daily.  Marland Kitchen omeprazole (PRILOSEC) 20 MG capsule Take 20 mg by mouth 2 (two) times daily before a meal.  . Peppermint Oil (IBGARD) 90 MG CPCR Take 2 capsules by mouth daily.  . polyethylene glycol (MIRALAX / GLYCOLAX) packet Take 17 g by mouth daily.   No facility-administered encounter medications on file as of 07/16/2018.     Allergies as of 07/16/2018 - Review Complete 07/16/2018  Allergen Reaction Noted  . Estrogens  02/26/2018    Past Medical  History:  Diagnosis Date  . Arthritis   . Clotting disorder ([place])    Has a venous malformation in left foot which causes superficial clots  . Gastroparesis   . GERD (gastroesophageal reflux disease)   . Nephrolithiasis     Past Surgical History:  Procedure Laterality Date  . CHOLECYSTECTOMY    . ear reconstructive     age 71  . MASTOIDECTOMY     age 24    Family History  Problem Relation Age of Onset  . Diabetes Father   . Uterine cancer Maternal Grandmother   . Colon cancer Maternal Aunt     Social History   Socioeconomic History  . Marital status: Married    Spouse name: Not on file  . Number of children: 2  . Years of education: Not on file  . Highest education level: Not on file  Occupational History  . Occupation: Pharmacist, hospital  Social Needs  . Financial resource strain: Not on file  . Food insecurity:    Worry: Not on file    Inability: Not on file  . Transportation needs:    Medical: Not on file    Non-medical: Not on file  Tobacco Use  . Smoking status: Never Smoker  . Smokeless tobacco: Never Used  Substance and Sexual Activity  . Alcohol use: Yes  . Drug use: Never  . Sexual activity: Not on file  Lifestyle  . Physical activity:    Days per week: Not on file    Minutes per session: Not on file  . Stress:  Not on file  Relationships  . Social connections:    Talks on phone: Not on file    Gets together: Not on file    Attends religious service: Not on file    Active member of club or organization: Not on file    Attends meetings of clubs or organizations: Not on file    Relationship status: Not on file  . Intimate partner violence:    Fear of current or ex partner: Not on file    Emotionally abused: Not on file    Physically abused: Not on file    Forced sexual activity: Not on file  Other Topics Concern  . Not on file  Social History Narrative  . Not on file      Review of systems: Review of Systems  Constitutional: Negative for fever  and chills.  HENT: Negative.   Eyes: Negative for blurred vision.  Respiratory: Negative for cough, shortness of breath and wheezing.   Cardiovascular: Negative for chest pain and palpitations.  Gastrointestinal: as per HPI Genitourinary: Negative for dysuria, urgency, frequency and hematuria.  Musculoskeletal: Positive for myalgias, back pain and joint pain.  Skin: Negative for itching and rash.  Neurological: Negative for dizziness, tremors, focal weakness, seizures and loss of consciousness.  Endo/Heme/Allergies: Negative for seasonal allergies.  Psychiatric/Behavioral: Negative for depression, suicidal ideas and hallucinations.  All other systems reviewed and are negative.   Physical Exam: Vitals:   07/16/18 1023  BP: 98/62  Pulse: 68   Body mass index is 24 kg/m. Gen:      No acute distress HEENT:  EOMI, sclera anicteric Neck:     No masses; no thyromegaly Lungs:    Clear to auscultation bilaterally; normal respiratory effort CV:         Regular rate and rhythm; no murmurs Abd:      + bowel sounds; soft, non-tender; no palpable masses, no distension Ext:    No edema; adequate peripheral perfusion Skin:      Warm and dry; no rash Neuro: alert and oriented x 3 Psych: normal mood and affect  Data Reviewed:  Reviewed labs, radiology imaging, old records and pertinent past GI work up   Assessment and Plan/Recommendations:  42 year old female with celiac disease, GERD and irritable bowel syndrome Continue strict gluten-free diet Omeprazole 20 mg daily Antireflux measures No evidence of small intestinal bacterial overgrowth based on glucose breath test Follow-up CBC, CMP, B12, folate, TSH and iron panel  25 minutes was spent face-to-face with the patient. Greater than 50% of the time used for counseling as well as treatment plan and follow-up. She had multiple questions which were answered to her satisfaction  K. Denzil Magnuson , MD [phone]    CC: Brien Few, MD

## 2018-07-18 ENCOUNTER — Encounter: Payer: Self-pay | Admitting: Gastroenterology

## 2018-08-03 ENCOUNTER — Telehealth: Payer: Self-pay | Admitting: Gastroenterology

## 2018-08-03 NOTE — Telephone Encounter (Signed)
Spoke with the patient. She is aware the provider is out of the office. She has had her labs drawn at her PCP. She has a diagnosis of costochondritis. She is not taking NSAID. She is using Tylenol and heat. Adhering to a gluten free diet. Taking daily omeprazole 40 mg. She has a Lampert nausea without vomiting she is unsure if it is connected to her gastritis. She will use her Carafate she has to see if it helps.  Shirlean Mylar is looking for the faxed lab results.

## 2018-08-09 NOTE — Telephone Encounter (Signed)
Ok thanks 

## 2018-08-17 DIAGNOSIS — D72819 Decreased white blood cell count, unspecified: Secondary | ICD-10-CM | POA: Diagnosis not present

## 2018-08-17 DIAGNOSIS — R0781 Pleurodynia: Secondary | ICD-10-CM | POA: Diagnosis not present

## 2018-09-21 DIAGNOSIS — R079 Chest pain, unspecified: Secondary | ICD-10-CM | POA: Diagnosis not present

## 2018-09-24 DIAGNOSIS — R079 Chest pain, unspecified: Secondary | ICD-10-CM | POA: Diagnosis not present

## 2018-09-27 DIAGNOSIS — Z6824 Body mass index (BMI) 24.0-24.9, adult: Secondary | ICD-10-CM | POA: Diagnosis not present

## 2018-09-27 DIAGNOSIS — Q279 Congenital malformation of peripheral vascular system, unspecified: Secondary | ICD-10-CM | POA: Diagnosis not present

## 2019-01-15 DIAGNOSIS — Z20828 Contact with and (suspected) exposure to other viral communicable diseases: Secondary | ICD-10-CM | POA: Diagnosis not present

## 2019-01-15 DIAGNOSIS — J029 Acute pharyngitis, unspecified: Secondary | ICD-10-CM | POA: Diagnosis not present

## 2019-01-17 DIAGNOSIS — J029 Acute pharyngitis, unspecified: Secondary | ICD-10-CM | POA: Diagnosis not present

## 2019-01-28 ENCOUNTER — Telehealth: Payer: Self-pay | Admitting: Gastroenterology

## 2019-01-28 NOTE — Telephone Encounter (Signed)
Left message to call back to discuss her issues.

## 2019-01-28 NOTE — Telephone Encounter (Signed)
Patient is returning your call.  

## 2019-01-28 NOTE — Telephone Encounter (Signed)
Patient called and stated that she thinks she is having a fare up of gastroparesis and is needing to know what she can take to coat her stomach so that she is able to continue to take her 40mg  of baby asprin? Please call and advised.

## 2019-01-31 ENCOUNTER — Telehealth: Payer: Self-pay | Admitting: Gastroenterology

## 2019-01-31 NOTE — Telephone Encounter (Signed)
See alternate phone note  

## 2019-02-01 ENCOUNTER — Telehealth: Payer: Self-pay | Admitting: Gastroenterology

## 2019-02-01 NOTE — Telephone Encounter (Signed)
Patient call back  °

## 2019-02-01 NOTE — Telephone Encounter (Signed)
Patient called in wanting to speak with the nurse she is wanting to know how she can take Asprin with her blood clotting disorder. She is needing advice

## 2019-02-02 ENCOUNTER — Other Ambulatory Visit: Payer: Self-pay | Admitting: *Deleted

## 2019-02-02 MED ORDER — SUCRALFATE 1 G PO TABS: 1.0000 g | ORAL_TABLET | Freq: Three times a day (TID) | ORAL | 3 refills | Status: DC

## 2019-02-02 NOTE — Telephone Encounter (Signed)
Okay to send refill for Carafate 1 g before meals and at bedtime as needed 30-day supply with 3 refills.  Please schedule virtual office visit next available.  Thank you

## 2019-02-02 NOTE — Telephone Encounter (Signed)
Immediately upon speaking with the patient, "I'm pretty sure I have gastritis" she states. The patient reports a globus sensation, intermittent morning heartburn, along with a cough and clearing of the throat which the patient states she is taking Gaviscon for with minimal relief. The patient states that she was previously prescribed by another MD Carafate which tremendously helped. She has some leftover but it expired in February. The patient is requesting a prescription of Carafate to help with her symptoms since her telehealth appt is not until 7/20. Please advise.

## 2019-02-02 NOTE — Telephone Encounter (Signed)
Carafate prescription sent to the pharmacy. Patient notified.

## 2019-02-10 DIAGNOSIS — M25549 Pain in joints of unspecified hand: Secondary | ICD-10-CM | POA: Diagnosis not present

## 2019-02-10 DIAGNOSIS — E559 Vitamin D deficiency, unspecified: Secondary | ICD-10-CM | POA: Diagnosis not present

## 2019-02-10 DIAGNOSIS — Z0000 Encounter for general adult medical examination without abnormal findings: Secondary | ICD-10-CM | POA: Diagnosis not present

## 2019-02-11 DIAGNOSIS — K900 Celiac disease: Secondary | ICD-10-CM | POA: Diagnosis not present

## 2019-02-11 DIAGNOSIS — K219 Gastro-esophageal reflux disease without esophagitis: Secondary | ICD-10-CM | POA: Diagnosis not present

## 2019-02-11 DIAGNOSIS — B370 Candidal stomatitis: Secondary | ICD-10-CM | POA: Diagnosis not present

## 2019-02-11 DIAGNOSIS — G4700 Insomnia, unspecified: Secondary | ICD-10-CM | POA: Diagnosis not present

## 2019-02-21 ENCOUNTER — Other Ambulatory Visit: Payer: Self-pay

## 2019-02-21 ENCOUNTER — Encounter: Payer: Self-pay | Admitting: Physician Assistant

## 2019-02-21 ENCOUNTER — Ambulatory Visit (INDEPENDENT_AMBULATORY_CARE_PROVIDER_SITE_OTHER): Payer: BC Managed Care – PPO | Admitting: Physician Assistant

## 2019-02-21 ENCOUNTER — Telehealth: Payer: Self-pay | Admitting: Physician Assistant

## 2019-02-21 VITALS — Ht 64.0 in | Wt 145.0 lb

## 2019-02-21 DIAGNOSIS — K219 Gastro-esophageal reflux disease without esophagitis: Secondary | ICD-10-CM

## 2019-02-21 DIAGNOSIS — B370 Candidal stomatitis: Secondary | ICD-10-CM

## 2019-02-21 DIAGNOSIS — R1013 Epigastric pain: Secondary | ICD-10-CM | POA: Diagnosis not present

## 2019-02-21 DIAGNOSIS — K900 Celiac disease: Secondary | ICD-10-CM

## 2019-02-21 DIAGNOSIS — K2970 Gastritis, unspecified, without bleeding: Secondary | ICD-10-CM

## 2019-02-21 MED ORDER — NYSTATIN 100000 UNIT/ML MT SUSP: 5.0000 mL | Freq: Four times a day (QID) | OROMUCOSAL | 0 refills | Status: DC

## 2019-02-21 MED ORDER — OMEPRAZOLE 20 MG PO CPDR: 20.0000 mg | DELAYED_RELEASE_CAPSULE | Freq: Two times a day (BID) | ORAL | 11 refills | Status: DC

## 2019-02-21 NOTE — Patient Instructions (Addendum)
If you are age 41 or older, your body mass index should be between 23-30. Your Body mass index is 24.89 kg/m. If this is out of the aforementioned range listed, please consider follow up with your Primary Care Provider.  If you are age 59 or younger, your body mass index should be between 19-25. Your Body mass index is 24.89 kg/m. If this is out of the aformentioned range listed, please consider follow up with your Primary Care Provider.   We have sent the following medications to your pharmacy for you to pick up at your convenience: Prilosec - Continue 40 mg daily Mycostatin Pepcid 40 mg every evening for one month (over-the-counter)  Follow up with me on March 15, 2019 at 10 am.  Thank you for choosing me and [facility].   Amy Esterwood, PA-C

## 2019-02-21 NOTE — Telephone Encounter (Signed)
Takes FDgard PRN. Discussed the correct way to take Omeprazole. She does not have any drug to drug interaction with the FDguard. Hopefully will not need it, but can safely take it if she finds it useful.  Is this okay?

## 2019-02-21 NOTE — Progress Notes (Signed)
Subjective:    Patient ID: Julie Meyer, female    DOB: 06/11/1976, 43 y.o.   MRN: [id number] This service was provided via telemedicine.  Telephone call. The patient was located at home. The provider was located in provider's GI office. The patient did consent to this telephone visit and is aware of possible charges with her insurance for this visit. To persons participating in this telemedicine service were the patient and I. Time spent on call; 67min HPI Julie Meyer is a pleasant 43 year old white female, known to Dr. Silverio Decamp, with history of GERD, IBS, celiac disease, nephrolithiasis, and is status post cholecystectomy.  She was last seen in December 2019 for follow-up.  She had undergone glucose breath testing at that time which was negative for bacterial overgrowth. Patient says her current symptoms started after she, her husband and her son were all ill in early June.  She says she had GI symptoms for a couple of days, a bad headache cough severe fatigue and generally felt "horrible".  She did not have any fever.  She was eventually tested for COVID and was negative.  Around that time she was taking baby aspirin daily and cold medicine.  She says she supposed to be on a baby aspirin every day but does not always take it because it tends to bother her stomach.  Since then she developed indigestion, heartburn and epigastric pain she describes as being high in the epigastrium and just to the right.  Says usually this is more prominent on an empty stomach or in the middle of the night.  She has been using Gaviscon which is helpful.  She called the office towards the end of June with the symptoms and a prescription for Carafate was called in.  But patient called the company and it was not clear whether or not the Carafate may contain gluten so she did not want to take it. She has been taking 40 mg of omeprazole daily over the past several months.  No NSAIDs. She also says she has had a white tongue over  the past several weeks.  She was given a short course of a clotrimazole lozenge by her PCP which did not clear her symptoms. She has adjusted her diet has been eating very carefully but was concerned with persistence of symptoms.  Review of Systems Pertinent positive and negative review of systems were noted in the above HPI section.  All other review of systems was otherwise negative.  Outpatient Encounter Medications as of 02/21/2019  Medication Sig  . Al Hyd-Mg Tr-Alg Ac-Sod Bicarb (GAVISCON-2 PO) Take 1 capsule by mouth as needed.  Marland Kitchen omeprazole (PRILOSEC) 20 MG capsule Take 1 capsule (20 mg total) by mouth 2 (two) times daily before a meal.  . [DISCONTINUED] omeprazole (PRILOSEC) 20 MG capsule Take 20 mg by mouth 2 (two) times daily before a meal.  . aspirin 81 MG tablet Take 81 mg by mouth as needed for pain.  Marland Kitchen ibuprofen (ADVIL,MOTRIN) 200 MG tablet Take 200 mg by mouth as needed.  . Lactobacillus Rhamnosus, GG, (CULTURELLE PO) Take 1 capsule by mouth daily.  Marland Kitchen nystatin (MYCOSTATIN) 100000 UNIT/ML suspension Take 5 mLs (500,000 Units total) by mouth 4 (four) times daily. Swish and swallow four times daily for 2 weeks.  Marland Kitchen Peppermint Oil (IBGARD) 90 MG CPCR Take 2 capsules by mouth daily.  . polyethylene glycol (MIRALAX / GLYCOLAX) packet Take 17 g by mouth daily.  . sucralfate (CARAFATE) 1 g tablet Take 1 tablet (1  g total) by mouth 4 (four) times daily -  with meals and at bedtime. (Patient not taking: Reported on 02/21/2019)   No facility-administered encounter medications on file as of 02/21/2019.    Allergies  Allergen Reactions  . Estrogens     Clots    There are no active problems to display for this patient.  Social History   Socioeconomic History  . Marital status: Married    Spouse name: Not on file  . Number of children: 2  . Years of education: Not on file  . Highest education level: Not on file  Occupational History  . Occupation: Pharmacist, hospital  Social Needs  .  Financial resource strain: Not on file  . Food insecurity    Worry: Not on file    Inability: Not on file  . Transportation needs    Medical: Not on file    Non-medical: Not on file  Tobacco Use  . Smoking status: Never Smoker  . Smokeless tobacco: Never Used  Substance and Sexual Activity  . Alcohol use: Yes  . Drug use: Never  . Sexual activity: Yes    Birth control/protection: I.U.D.  Lifestyle  . Physical activity    Days per week: Not on file    Minutes per session: Not on file  . Stress: Not on file  Relationships  . Social Herbalist on phone: Not on file    Gets together: Not on file    Attends religious service: Not on file    Active member of club or organization: Not on file    Attends meetings of clubs or organizations: Not on file    Relationship status: Not on file  . Intimate partner violence    Fear of current or ex partner: Not on file    Emotionally abused: Not on file    Physically abused: Not on file    Forced sexual activity: Not on file  Other Topics Concern  . Not on file  Social History Narrative  . Not on file    Julie Meyer's family history includes Colon cancer in her maternal aunt; Colon polyps in her mother; Diabetes in her father; Stomach cancer in an other family member; Uterine cancer in her maternal grandmother.      Objective:    There were no vitals filed for this visit.  Physical Exam no exam, this was a telephone visit       Assessment & Plan:   #53 43 year old female with history of chronic GERD now with 6-week history of dyspepsia, epigastric discomfort. Suspect gastritis, possibly aspirin induced.  #2 status post cholecystectomy #3.  Celiac disease-patient follows very strict gluten-free diet #4 probable oral candidiasis/thrush  Plan; Continue omeprazole 40 mg p.o. every morning. Add Pepcid 40 mg p.o. every evening-patient will use OTC Pepcid, which she knows to be gluten-free.  Advised to stay on Pepcid for  at least a month, and if symptoms have resolved go back to omeprazole p.o. every morning Start Mycostatin oral suspension cc swish and swallow 4 times daily x2 weeks Patient is asked to call back in 2 to 3 weeks if symptoms are not significantly improved, and at that time will likely need to be seen in the office by Dr. Rush Landmark or myself.  Amy S Esterwood PA-C 02/21/2019   Cc: Brien Few, MD

## 2019-02-22 NOTE — Telephone Encounter (Signed)
Advised the patient of this recommendation.

## 2019-02-22 NOTE — Telephone Encounter (Signed)
Ok to continue FD gard - would take it  At least a few hours away fromOmeprazole

## 2019-02-24 NOTE — Progress Notes (Signed)
Reviewed and agree with documentation and assessment and plan. K. Veena Donnice Nielsen , MD   

## 2019-03-15 ENCOUNTER — Ambulatory Visit: Payer: Self-pay | Admitting: Physician Assistant

## 2019-03-23 DIAGNOSIS — M2550 Pain in unspecified joint: Secondary | ICD-10-CM | POA: Diagnosis not present

## 2019-03-23 DIAGNOSIS — M25552 Pain in left hip: Secondary | ICD-10-CM | POA: Diagnosis not present

## 2019-03-24 ENCOUNTER — Other Ambulatory Visit: Payer: Self-pay | Admitting: Family Medicine

## 2019-03-24 ENCOUNTER — Ambulatory Visit
Admission: RE | Admit: 2019-03-24 | Discharge: 2019-03-24 | Disposition: A | Discharge status: Home / Self Care | Payer: BC Managed Care – PPO | Source: Ambulatory Visit | Attending: Family Medicine | Admitting: Family Medicine

## 2019-03-24 DIAGNOSIS — M25552 Pain in left hip: Secondary | ICD-10-CM | POA: Diagnosis not present

## 2019-03-30 DIAGNOSIS — M25552 Pain in left hip: Secondary | ICD-10-CM | POA: Diagnosis not present

## 2019-03-30 DIAGNOSIS — R262 Difficulty in walking, not elsewhere classified: Secondary | ICD-10-CM | POA: Diagnosis not present

## 2019-03-30 DIAGNOSIS — M545 Low back pain: Secondary | ICD-10-CM | POA: Diagnosis not present

## 2019-03-30 DIAGNOSIS — M6281 Muscle weakness (generalized): Secondary | ICD-10-CM | POA: Diagnosis not present

## 2019-04-01 DIAGNOSIS — M545 Low back pain: Secondary | ICD-10-CM | POA: Diagnosis not present

## 2019-04-01 DIAGNOSIS — M6281 Muscle weakness (generalized): Secondary | ICD-10-CM | POA: Diagnosis not present

## 2019-04-01 DIAGNOSIS — M25552 Pain in left hip: Secondary | ICD-10-CM | POA: Diagnosis not present

## 2019-04-01 DIAGNOSIS — R262 Difficulty in walking, not elsewhere classified: Secondary | ICD-10-CM | POA: Diagnosis not present

## 2019-04-05 DIAGNOSIS — R262 Difficulty in walking, not elsewhere classified: Secondary | ICD-10-CM | POA: Diagnosis not present

## 2019-04-05 DIAGNOSIS — M545 Low back pain: Secondary | ICD-10-CM | POA: Diagnosis not present

## 2019-04-05 DIAGNOSIS — M25552 Pain in left hip: Secondary | ICD-10-CM | POA: Diagnosis not present

## 2019-04-05 DIAGNOSIS — M6281 Muscle weakness (generalized): Secondary | ICD-10-CM | POA: Diagnosis not present

## 2019-04-08 DIAGNOSIS — M6281 Muscle weakness (generalized): Secondary | ICD-10-CM | POA: Diagnosis not present

## 2019-04-08 DIAGNOSIS — M25552 Pain in left hip: Secondary | ICD-10-CM | POA: Diagnosis not present

## 2019-04-08 DIAGNOSIS — R262 Difficulty in walking, not elsewhere classified: Secondary | ICD-10-CM | POA: Diagnosis not present

## 2019-04-08 DIAGNOSIS — M545 Low back pain: Secondary | ICD-10-CM | POA: Diagnosis not present

## 2019-04-18 DIAGNOSIS — M545 Low back pain: Secondary | ICD-10-CM | POA: Diagnosis not present

## 2019-04-18 DIAGNOSIS — M25552 Pain in left hip: Secondary | ICD-10-CM | POA: Diagnosis not present

## 2019-04-18 DIAGNOSIS — M6281 Muscle weakness (generalized): Secondary | ICD-10-CM | POA: Diagnosis not present

## 2019-04-18 DIAGNOSIS — R262 Difficulty in walking, not elsewhere classified: Secondary | ICD-10-CM | POA: Diagnosis not present

## 2019-06-15 ENCOUNTER — Other Ambulatory Visit: Payer: Self-pay

## 2019-06-15 DIAGNOSIS — Z20822 Contact with and (suspected) exposure to covid-19: Secondary | ICD-10-CM

## 2019-06-17 LAB — NOVEL CORONAVIRUS, NAA: SARS-CoV-2, NAA: NOT DETECTED

## 2019-08-10 ENCOUNTER — Ambulatory Visit: Payer: BC Managed Care – PPO | Admitting: Gastroenterology

## 2019-08-10 ENCOUNTER — Other Ambulatory Visit: Payer: Self-pay

## 2019-08-10 ENCOUNTER — Encounter: Payer: Self-pay | Admitting: Gastroenterology

## 2019-08-10 VITALS — BP 92/70 | HR 60 | Temp 97.9°F | Ht 64.0 in | Wt 150.0 lb

## 2019-08-10 DIAGNOSIS — K900 Celiac disease: Secondary | ICD-10-CM

## 2019-08-10 DIAGNOSIS — R109 Unspecified abdominal pain: Secondary | ICD-10-CM

## 2019-08-10 NOTE — Progress Notes (Signed)
Julie Meyer    [id number]    1976-01-03  Primary Care Physician:Koirala, Dibas, MD  Referring Physician: Lujean Amel, MD [place],   [id number]   Chief complaint: Abdominal bloating  HPI:  44 year old female with history of celiac disease and GERD here for follow-up visit.  She is trying to follow a gluten-free diet strictly and is also trying to avoid any cross-contamination.  She is having intermittent abdominal bloating and discomfort has some relief with IBgard.  She is taking omeprazole 20 mg daily for GERD related symptoms and also takes Beano every night at bedtime.  Overall she needs somewhat able to manage her symptoms. Denies any nausea, vomiting, abdominal pain, melena or bright red blood per rectum  Colonoscopy March 12, 2018: 9 mm polyp [tubular adenoma] removed from ascending colon and 1 mm polyp [sessile serrated adenoma] from transverse colon, internal hemorrhoids otherwise normal exam  Glucose breath test: May 30, 2018 did not show any increase in hydrogen or methane level to suggest small intestinal bacterial overgrowth  Outpatient Encounter Medications as of 08/10/2019  Medication Sig  . Al Hyd-Mg Tr-Alg Ac-Sod Bicarb (GAVISCON-2 PO) Take 1 capsule by mouth as needed.  . Alpha-D-Galactosidase (BEANO) TABS Take 1 tablet by mouth 3 (three) times daily before meals.  Marland Kitchen aspirin 81 MG tablet Take 81 mg by mouth as needed for pain.  . famotidine (PEPCID) 20 MG tablet Take 20 mg by mouth at bedtime.  Marland Kitchen omeprazole (PRILOSEC) 20 MG capsule Take 1 capsule (20 mg total) by mouth 2 (two) times daily before a meal. (Patient taking differently: Take 20 mg by mouth daily. )  . Peppermint Oil (IBGARD) 90 MG CPCR Take 2 capsules by mouth daily.  . polyethylene glycol (MIRALAX / GLYCOLAX) packet Take 17 g by mouth daily.  . [DISCONTINUED] ibuprofen (ADVIL,MOTRIN) 200 MG tablet Take 200 mg by mouth as needed.  . [DISCONTINUED]  Lactobacillus Rhamnosus, GG, (CULTURELLE PO) Take 1 capsule by mouth daily.  . [DISCONTINUED] nystatin (MYCOSTATIN) 100000 UNIT/ML suspension Take 5 mLs (500,000 Units total) by mouth 4 (four) times daily. Swish and swallow four times daily for 2 weeks.  . [DISCONTINUED] sucralfate (CARAFATE) 1 g tablet Take 1 tablet (1 g total) by mouth 4 (four) times daily -  with meals and at bedtime. (Patient not taking: Reported on 02/21/2019)   No facility-administered encounter medications on file as of 08/10/2019.    Allergies as of 08/10/2019 - Review Complete 08/10/2019  Allergen Reaction Noted  . Estrogens  02/26/2018    Past Medical History:  Diagnosis Date  . Arthritis   . Clotting disorder ([facility])    Has a venous malformation in left foot which causes superficial clots  . Gastroparesis   . GERD (gastroesophageal reflux disease)   . Nephrolithiasis     Past Surgical History:  Procedure Laterality Date  . CHOLECYSTECTOMY    . ear reconstructive     age 45  . MASTOIDECTOMY     age 55    Family History  Problem Relation Age of Onset  . Diabetes Father   . Uterine cancer Maternal Grandmother   . Colon cancer Maternal Aunt   . Colon polyps Mother   . Stomach cancer Other   . Liver cancer Neg Hx   . Pancreatic cancer Neg Hx   . Rectal cancer Neg Hx     Social History   Socioeconomic History  . Marital  status: Married    Spouse name: Not on file  . Number of children: 2  . Years of education: Not on file  . Highest education level: Not on file  Occupational History  . Occupation: Pharmacist, hospital  Tobacco Use  . Smoking status: Never Smoker  . Smokeless tobacco: Never Used  Substance and Sexual Activity  . Alcohol use: Yes  . Drug use: Never  . Sexual activity: Yes    Birth control/protection: I.U.D.  Other Topics Concern  . Not on file  Social History Narrative  . Not on file   Social Determinants of Health   Financial Resource Strain:   . Difficulty of Paying Living  Expenses: Not on file  Food Insecurity:   . Worried About Charity fundraiser in the Last Year: Not on file  . Ran Out of Food in the Last Year: Not on file  Transportation Needs:   . Lack of Transportation (Medical): Not on file  . Lack of Transportation (Non-Medical): Not on file  Physical Activity:   . Days of Exercise per Week: Not on file  . Minutes of Exercise per Session: Not on file  Stress:   . Feeling of Stress : Not on file  Social Connections:   . Frequency of Communication with Friends and Family: Not on file  . Frequency of Social Gatherings with Friends and Family: Not on file  . Attends Religious Services: Not on file  . Active Member of Clubs or Organizations: Not on file  . Attends Archivist Meetings: Not on file  . Marital Status: Not on file  Intimate Partner Violence:   . Fear of Current or Ex-Partner: Not on file  . Emotionally Abused: Not on file  . Physically Abused: Not on file  . Sexually Abused: Not on file      Review of systems: Review of Systems  Constitutional: Negative for fever and chills.  HENT: Negative.   Eyes: Negative for blurred vision.  Respiratory: Negative for cough, shortness of breath and wheezing.   Cardiovascular: Negative for chest pain and palpitations.  Gastrointestinal: as per HPI Genitourinary: Negative for dysuria, urgency, frequency and hematuria.  Musculoskeletal: Negative for myalgias, back pain and joint pain.  Skin: Negative for itching and rash.  Neurological: Negative for dizziness, tremors, focal weakness, seizures and loss of consciousness.  Endo/Heme/Allergies: Positive for seasonal allergies.  Psychiatric/Behavioral: Negative for depression, suicidal ideas and hallucinations.  All other systems reviewed and are negative.   Physical Exam: Vitals:   08/10/19 0842  BP: 92/70  Pulse: 60  Temp: 97.9 F (36.6 C)   Body mass index is 25.75 kg/m. Gen:      No acute distress HEENT:  EOMI, sclera  anicteric Neck:     No masses; no thyromegaly Lungs:    Clear to auscultation bilaterally; normal respiratory effort CV:         Regular rate and rhythm; no murmurs Abd:      + bowel sounds; soft, non-tender; no palpable masses, no distension Ext:    No edema; adequate peripheral perfusion Skin:      Warm and dry; no rash Neuro: alert and oriented x 3 Psych: normal mood and affect  Data Reviewed:  Reviewed labs, radiology imaging, old records and pertinent past GI work up   Assessment and Plan/Recommendations:  44 year old female with celiac disease, chronic GERD, irritable bowel syndrome with abdominal bloating and cramping  Continue strict gluten-free diet, and avoid cross-contamination to prevent exacerbation of  symptoms with celiac disease  We will plan for EGD to evaluate, exclude gastroduodenitis if continues to have persistent symptoms in the next 4 to 6 months. Advised patient to maintain food symptom diary to track any potential food sensitivities or intolerance  IBS, abdominal bloating and cramping Glucose breath test negative for small intestinal bacterial overgrowth Continue to use IBgard up to 3 times daily as needed and Beano at bedtime as needed  We will do a trial of Creon 1 capsule with every meal for 1 to 2 weeks to see if she has any improvement for IBS symptoms  GERD: Continue omeprazole 20 mg daily Continue antireflux measures and lifestyle modifications  Return in 6 months  This visit required 30 minutes of patient care (this includes precharting, chart review, review of results, face-to-face time used for counseling as well as treatment plan and follow-up. The patient was provided an opportunity to ask questions and all were answered. The patient agreed with the plan and demonstrated an understanding of the instructions.   Damaris Hippo , MD    CC: Lujean Amel, MD

## 2019-08-10 NOTE — Patient Instructions (Signed)
We have put a recall in our system for you to have a Endoscopy recall in June, we will mail you out a reminder letter  Follow up in 6 months after the Endoscopy is completed  Take Creon samples 1-2 capsules with dinner  Use IBGard daily as needed  If you are age 42 or older, your body mass index should be between 23-30. Your Body mass index is 25.75 kg/m. If this is out of the aforementioned range listed, please consider follow up with your Primary Care Provider.  If you are age 37 or younger, your body mass index should be between 19-25. Your Body mass index is 25.75 kg/m. If this is out of the aformentioned range listed, please consider follow up with your Primary Care Provider.    I appreciate the  opportunity to care for you  Thank You   Harl Bowie , MD

## 2019-08-13 ENCOUNTER — Encounter: Payer: Self-pay | Admitting: Gastroenterology

## 2019-08-16 DIAGNOSIS — Z0000 Encounter for general adult medical examination without abnormal findings: Secondary | ICD-10-CM | POA: Diagnosis not present

## 2019-08-23 DIAGNOSIS — Z1231 Encounter for screening mammogram for malignant neoplasm of breast: Secondary | ICD-10-CM | POA: Diagnosis not present

## 2019-08-31 DIAGNOSIS — E559 Vitamin D deficiency, unspecified: Secondary | ICD-10-CM | POA: Diagnosis not present

## 2019-08-31 DIAGNOSIS — Z0000 Encounter for general adult medical examination without abnormal findings: Secondary | ICD-10-CM | POA: Diagnosis not present

## 2019-08-31 DIAGNOSIS — Z13220 Encounter for screening for lipoid disorders: Secondary | ICD-10-CM | POA: Diagnosis not present

## 2019-09-12 DIAGNOSIS — Q279 Congenital malformation of peripheral vascular system, unspecified: Secondary | ICD-10-CM | POA: Diagnosis not present

## 2019-10-16 ENCOUNTER — Other Ambulatory Visit: Payer: Self-pay | Admitting: Physician Assistant

## 2019-11-02 DIAGNOSIS — H04123 Dry eye syndrome of bilateral lacrimal glands: Secondary | ICD-10-CM | POA: Diagnosis not present

## 2019-11-17 DIAGNOSIS — H02889 Meibomian gland dysfunction of unspecified eye, unspecified eyelid: Secondary | ICD-10-CM | POA: Diagnosis not present

## 2019-11-17 DIAGNOSIS — H04123 Dry eye syndrome of bilateral lacrimal glands: Secondary | ICD-10-CM | POA: Diagnosis not present

## 2019-11-29 IMAGING — CR DG HIP (WITH OR WITHOUT PELVIS) 2-3V LEFT
2 series · 2 of 2 positions shown · non-contrast
Comparison: None.

CLINICAL DATA: Left hip pain for 1 month, initial encounter

EXAM:
DG HIP (WITH OR WITHOUT PELVIS) 2V LEFT

[t hip ap left]
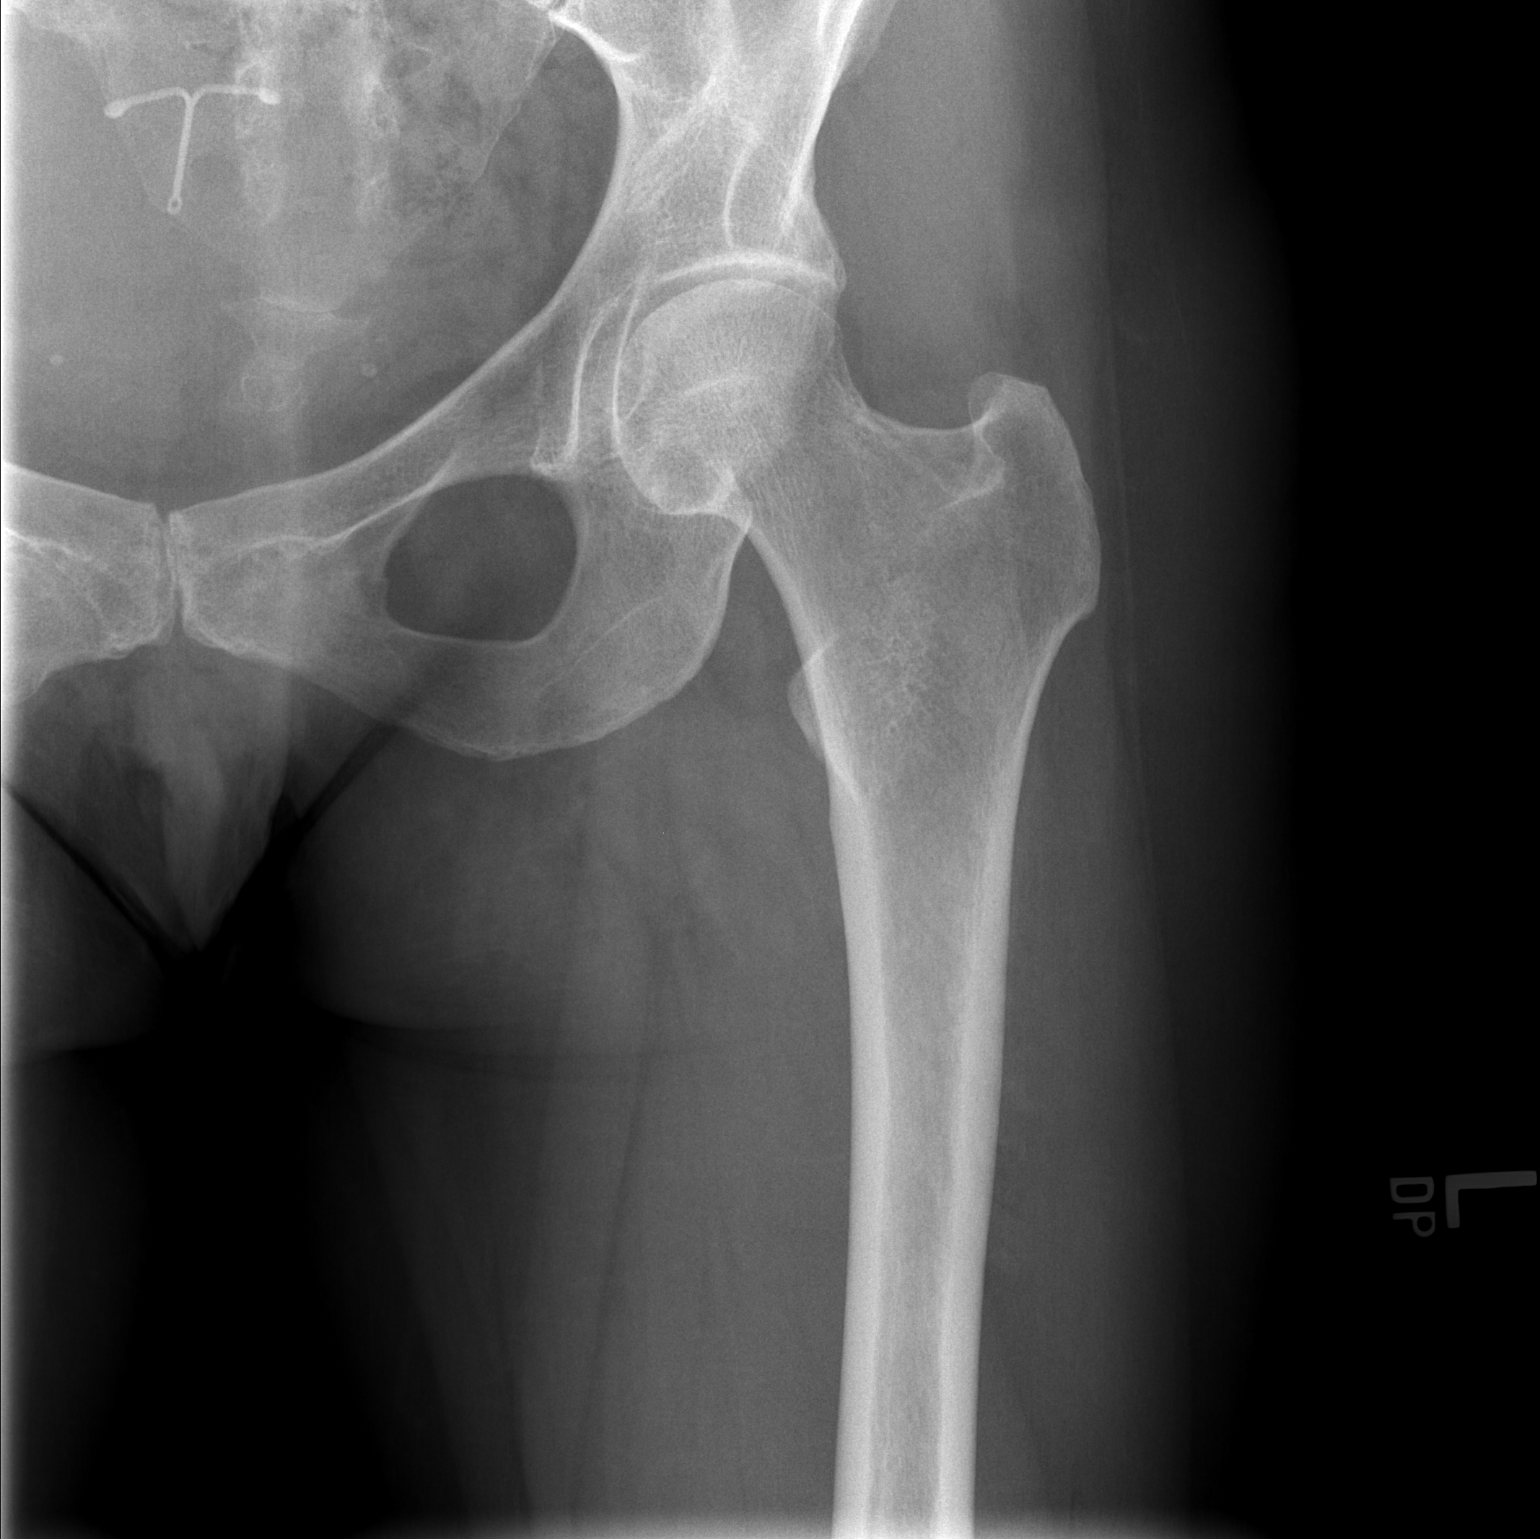

[t hip frog leg left]
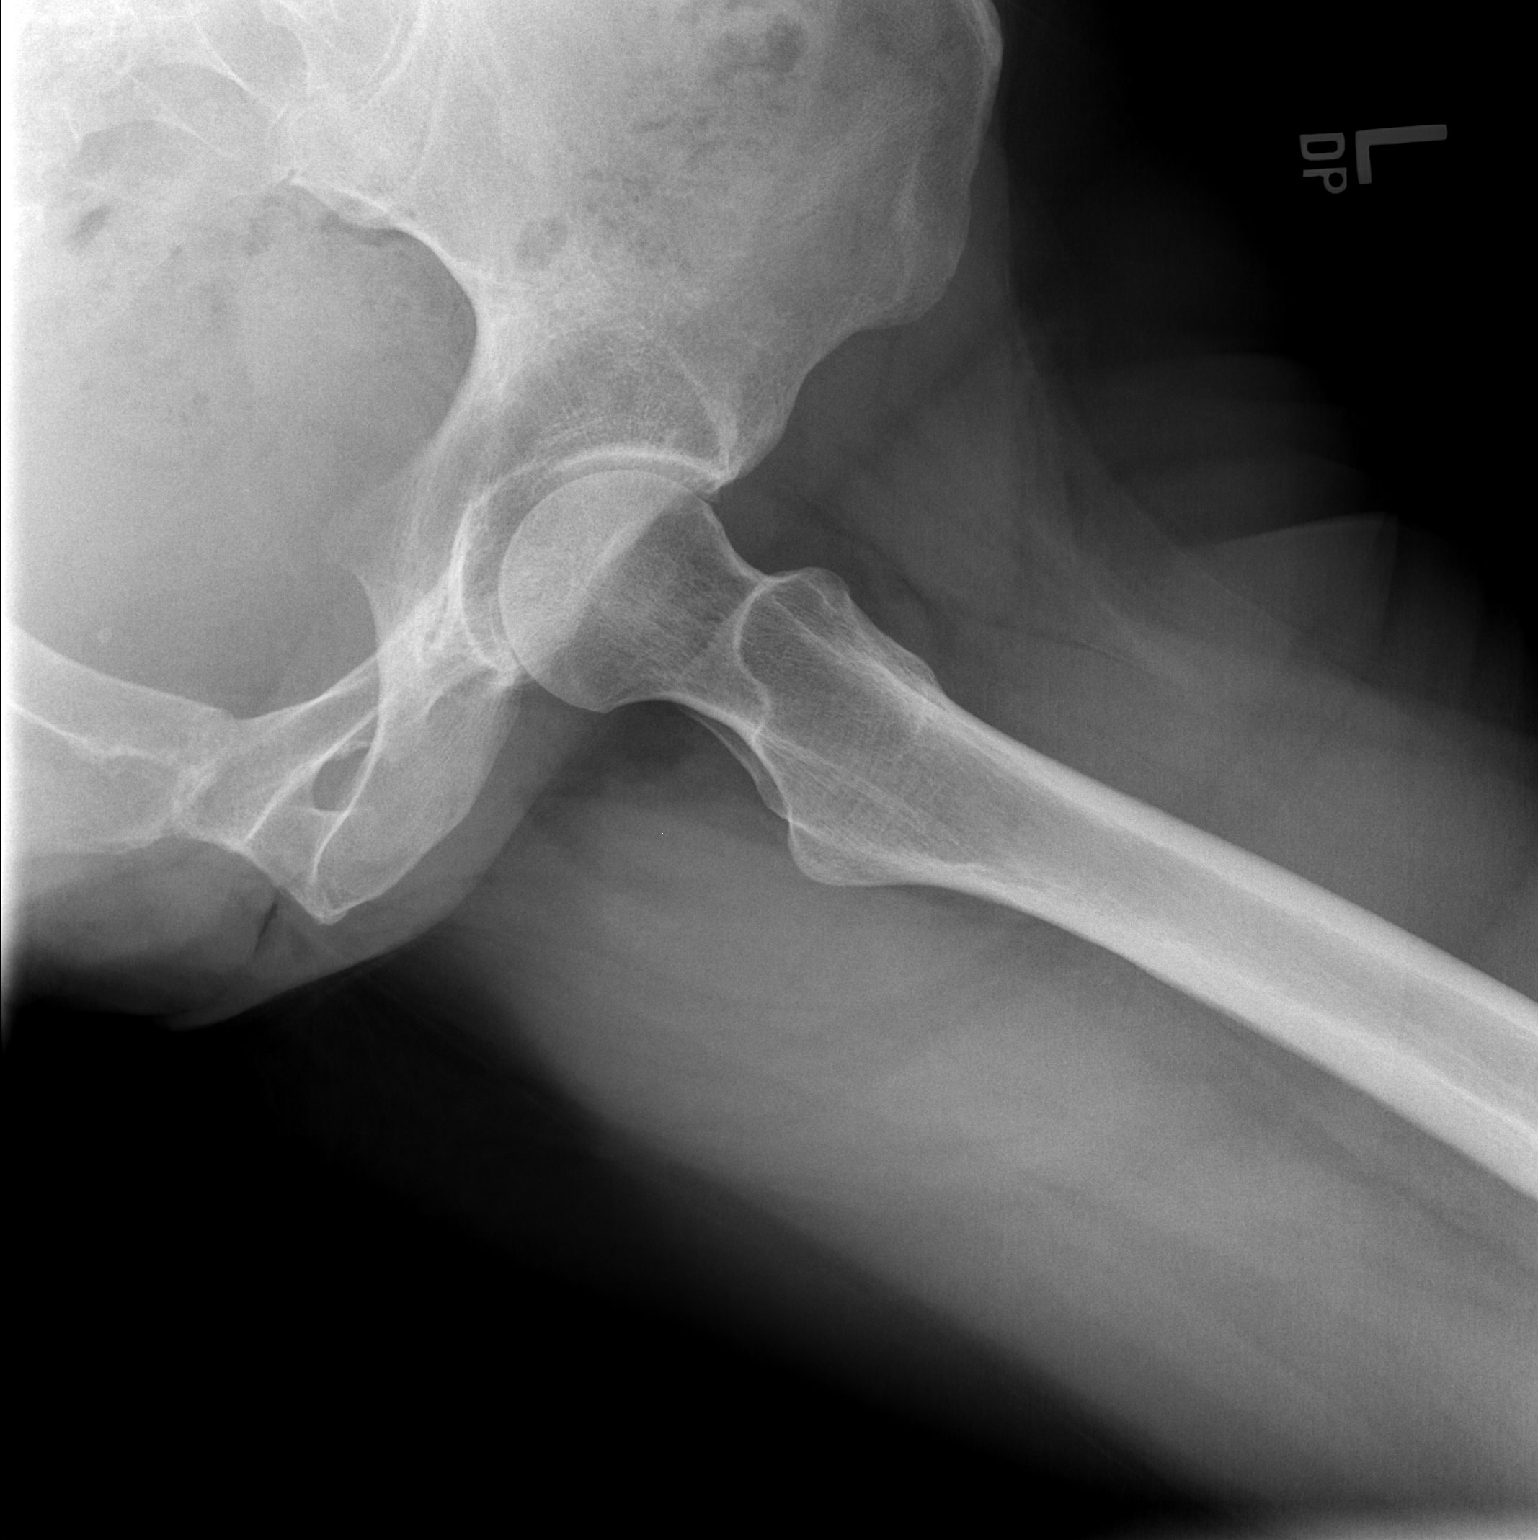

[2 of 2 positions shown; findings below may reference images not displayed]

FINDINGS: No acute fracture or dislocation is noted. An IUD is noted in place.
No soft tissue abnormality is seen.
IMPRESSION: No acute abnormality noted.

## 2019-12-05 DIAGNOSIS — D1801 Hemangioma of skin and subcutaneous tissue: Secondary | ICD-10-CM | POA: Diagnosis not present

## 2019-12-05 DIAGNOSIS — L718 Other rosacea: Secondary | ICD-10-CM | POA: Diagnosis not present

## 2019-12-05 DIAGNOSIS — D225 Melanocytic nevi of trunk: Secondary | ICD-10-CM | POA: Diagnosis not present

## 2020-01-09 ENCOUNTER — Other Ambulatory Visit: Payer: BC Managed Care – PPO

## 2020-03-01 ENCOUNTER — Telehealth: Payer: Self-pay | Admitting: Gastroenterology

## 2020-03-01 MED ORDER — OMEPRAZOLE 10 MG PO CPDR: 10.0000 mg | DELAYED_RELEASE_CAPSULE | Freq: Every day | ORAL | 3 refills | Status: DC

## 2020-03-01 NOTE — Telephone Encounter (Signed)
Called patient to find out if she wants prilosec or famotidine in the 10 mg   She wants to wean off omeprazole and wanted the 10 mg sent into her pharmacy   Omeprazole 10 mg sent

## 2020-03-01 NOTE — Telephone Encounter (Signed)
Patient called states her insurance changed so she wants her medications to go to [facility]. Also requested a lower dosage from   20 mg to 10 mg.

## 2020-03-01 NOTE — Telephone Encounter (Signed)
[address] sorry

## 2020-03-01 NOTE — Telephone Encounter (Signed)
Julie Meyer, Did she say which [name]? Thanks

## 2020-08-03 ENCOUNTER — Other Ambulatory Visit: Payer: Self-pay | Admitting: Gastroenterology

## 2020-08-07 ENCOUNTER — Ambulatory Visit (INDEPENDENT_AMBULATORY_CARE_PROVIDER_SITE_OTHER): Payer: 59 | Admitting: Gastroenterology

## 2020-08-07 ENCOUNTER — Encounter: Payer: Self-pay | Admitting: Gastroenterology

## 2020-08-07 VITALS — BP 90/64 | HR 60 | Ht 64.5 in | Wt 151.1 lb

## 2020-08-07 DIAGNOSIS — K900 Celiac disease: Secondary | ICD-10-CM | POA: Diagnosis not present

## 2020-08-07 DIAGNOSIS — R1011 Right upper quadrant pain: Secondary | ICD-10-CM | POA: Diagnosis not present

## 2020-08-07 DIAGNOSIS — R1013 Epigastric pain: Secondary | ICD-10-CM

## 2020-08-07 DIAGNOSIS — R140 Abdominal distension (gaseous): Secondary | ICD-10-CM

## 2020-08-07 DIAGNOSIS — R1012 Left upper quadrant pain: Secondary | ICD-10-CM

## 2020-08-07 DIAGNOSIS — K589 Irritable bowel syndrome without diarrhea: Secondary | ICD-10-CM

## 2020-08-07 NOTE — Progress Notes (Signed)
Julie Meyer    [id number]    03-22-1976  Primary Care Physician:Koirala, Dibas, MD  Referring Physician: Darrow Bussing, MD [address] [place],  [place] [address]   Chief complaint: Abdominal pain, bloating, gas, dyspepsia  HPI:  45 year old very pleasant female with celiac disease here for follow-up for dyspepsia, abdominal discomfort and bloating  Her symptoms have significantly improved with strict gluten-free diet, she continues to have intermittent symptoms.  She is using melatonin with some improvement and is also taking herbal digestive enzymes (Dr Formulated enzymes by [facility]) which seem to be helping as well.  She has not tried Creon.  She has excessive gas, worse after lunch and dinner and has to take Beano after meals  Denies any vomiting, melena or rectal bleeding.  Colonoscopy March 12, 2018: 9 mm polyp [tubular adenoma] removed from ascending colon and 1 mm polyp [sessile serrated adenoma] from transverse colon, internal hemorrhoids otherwise normal exam  Glucose breath test:May 30, 2018 did not show any increase in hydrogen or methane level to suggest small intestinal bacterial overgrowth  Outpatient Encounter Medications as of 08/07/2020  Medication Sig  . Al Hyd-Mg Tr-Alg Ac-Sod Bicarb (GAVISCON-2 PO) Take 1 capsule by mouth as needed.  . Alpha-D-Galactosidase (BEANO) TABS Take 1 tablet by mouth 3 (three) times daily before meals.  Marland Kitchen aspirin 81 MG tablet Take 81 mg by mouth as needed for pain.  Marland Kitchen DIGESTIVE ENZYMES PO Take 1 tablet by mouth daily.  . melatonin 3 MG TABS tablet Take 3 mg by mouth at bedtime.  Marland Kitchen omeprazole (PRILOSEC) 10 MG capsule TAKE ONE CAPSULE BY MOUTH DAILY  . Peppermint Oil 90 MG CPCR Take 2 capsules by mouth daily.  . polyethylene glycol (MIRALAX / GLYCOLAX) packet Take 17 g by mouth daily.  . [DISCONTINUED] famotidine (PEPCID) 20 MG tablet Take 1 tablet (20 mg total) by mouth 2 (two) times  daily.   No facility-administered encounter medications on file as of 08/07/2020.    Allergies as of 08/07/2020 - Review Complete 08/07/2020  Allergen Reaction Noted  . Estrogens  02/26/2018    Past Medical History:  Diagnosis Date  . Arthritis   . Clotting disorder ([facility])    Has a venous malformation in left foot which causes superficial clots  . Gastroparesis   . GERD (gastroesophageal reflux disease)   . Nephrolithiasis     Past Surgical History:  Procedure Laterality Date  . CHOLECYSTECTOMY    . ear reconstructive     age 57  . MASTOIDECTOMY     age 14    Family History  Problem Relation Age of Onset  . Diabetes Father   . Uterine cancer Maternal Grandmother   . Colon cancer Maternal Aunt   . Colon polyps Mother   . Stomach cancer Other   . Liver cancer Neg Hx   . Pancreatic cancer Neg Hx   . Rectal cancer Neg Hx     Social History   Socioeconomic History  . Marital status: Married    Spouse name: Not on file  . Number of children: 2  . Years of education: Not on file  . Highest education level: Not on file  Occupational History  . Occupation: Runner, broadcasting/film/video  Tobacco Use  . Smoking status: Never Smoker  . Smokeless tobacco: Never Used  Vaping Use  . Vaping Use: Never used  Substance and Sexual Activity  . Alcohol use: Yes  .  Drug use: Never  . Sexual activity: Yes    Birth control/protection: I.U.D.  Other Topics Concern  . Not on file  Social History Narrative  . Not on file   Social Determinants of Health   Financial Resource Strain: Not on file  Food Insecurity: Not on file  Transportation Needs: Not on file  Physical Activity: Not on file  Stress: Not on file  Social Connections: Not on file  Intimate Partner Violence: Not on file      Review of systems: All other review of systems negative except as mentioned in the HPI.   Physical Exam: Vitals:   08/07/20 0858  BP: 90/64  Pulse: 60   Body mass index is 25.54 kg/m. Gen:      No  acute distress HEENT:  sclera anicteric Abd:      soft, non-tender; no palpable masses, no distension Ext:    No edema Neuro: alert and oriented x 3 Psych: normal mood and affect  Data Reviewed:  Reviewed labs, radiology imaging, old records and pertinent past GI work up   Assessment and Plan/Recommendations:  45 year old very pleasant female with celiac disease, GERD and chronic irritable bowel syndrome with abdominal bloating  Celiac disease: Continue strict gluten-free diet, avoid cross-contamination Given persistent symptoms of dyspepsia, abdominal discomfort and bloating we will plan to proceed with EGD to evaluate, exclude gastroduodenitis, peptic ulcer disease or significant mucosal changes with celiac disease The risks and benefits as well as alternatives of endoscopic procedure(s) have been discussed and reviewed. All questions answered. The patient agrees to proceed.   IBS with abdominal bloating: Glucose breath test negative for intestinal bacterial overgrowth Ok to continue using Beano and melatonin Use IBgard 1 to 2 capsules up to 3 times daily as needed  GERD: Continue omeprazole 20 mg daily and antireflux measures  Return in 6 months or sooner if needed  The patient was provided an opportunity to ask questions and all were answered. The patient agreed with the plan and demonstrated an understanding of the instructions.  Damaris Hippo , MD    CC: Lujean Amel, MD

## 2020-08-07 NOTE — Patient Instructions (Signed)
You have been scheduled for an endoscopy. Please follow written instructions given to you at your visit today. If you use inhalers (even only as needed), please bring them with you on the day of your procedure.   Take Omeprazole and Pepcid as needed  Take IBGard as needed  Use OTC Beano as needed  Take Melatonin  Follow up in 6 months  I appreciate the  opportunity to care for you  Thank You   Marsa Aris , MD

## 2020-08-20 ENCOUNTER — Encounter: Payer: Self-pay | Admitting: Gastroenterology

## 2020-08-21 ENCOUNTER — Encounter: Payer: Self-pay | Admitting: Gastroenterology

## 2020-09-03 ENCOUNTER — Encounter: Payer: 59 | Admitting: Gastroenterology

## 2020-10-16 ENCOUNTER — Other Ambulatory Visit: Payer: Self-pay

## 2020-10-16 ENCOUNTER — Ambulatory Visit (AMBULATORY_SURGERY_CENTER): Payer: Self-pay

## 2020-10-16 VITALS — Ht 64.5 in | Wt 154.0 lb

## 2020-10-16 DIAGNOSIS — R1012 Left upper quadrant pain: Secondary | ICD-10-CM

## 2020-10-16 DIAGNOSIS — K900 Celiac disease: Secondary | ICD-10-CM

## 2020-10-16 DIAGNOSIS — R1011 Right upper quadrant pain: Secondary | ICD-10-CM

## 2020-10-16 NOTE — Progress Notes (Signed)
No egg or soy allergy known to patient  No issues with past sedation with any surgeries or procedures No intubation problems in the past  No FH of Malignant Hyperthermia No diet pills per patient No home 02 use per patient  No blood thinners per patient  Pt reports issues with constipation -on/off- take Miralax PRN No A fib or A flutter  EMMI video via [facility] guidelines implemented in [place] today with Pt and RN  Due to the COVID-19 pandemic we are asking patients to follow certain guidelines. Pt aware of COVID protocols and [facility] guidelines

## 2020-10-22 ENCOUNTER — Ambulatory Visit (AMBULATORY_SURGERY_CENTER): Payer: 59 | Admitting: Gastroenterology

## 2020-10-22 ENCOUNTER — Other Ambulatory Visit: Payer: Self-pay

## 2020-10-22 ENCOUNTER — Encounter: Payer: Self-pay | Admitting: Gastroenterology

## 2020-10-22 VITALS — BP 107/63 | HR 53 | Temp 97.8°F | Resp 15 | Ht 64.5 in | Wt 154.0 lb

## 2020-10-22 DIAGNOSIS — K900 Celiac disease: Secondary | ICD-10-CM

## 2020-10-22 DIAGNOSIS — K2100 Gastro-esophageal reflux disease with esophagitis, without bleeding: Secondary | ICD-10-CM | POA: Diagnosis present

## 2020-10-22 DIAGNOSIS — R109 Unspecified abdominal pain: Secondary | ICD-10-CM | POA: Diagnosis not present

## 2020-10-22 MED ORDER — OMEPRAZOLE 20 MG PO CPDR: 20.0000 mg | DELAYED_RELEASE_CAPSULE | Freq: Every day | ORAL | 3 refills | Status: DC

## 2020-10-22 MED ORDER — SODIUM CHLORIDE 0.9 % IV SOLN: 500.0000 mL | Freq: Once | INTRAVENOUS | Status: DC

## 2020-10-22 NOTE — Progress Notes (Signed)
Report to PACU, RN, vss, BBS= Clear.  

## 2020-10-22 NOTE — Op Note (Signed)
[facility] Patient Name: Julie Meyer Procedure Date: 10/22/2020 9:57 AM MRN: [id number] Endoscopist: Mauri Pole , MD Age: 45 Referring MD:  Date of Birth: 1976-08-02 Gender: Female Account #: [id number] Procedure:                Upper GI endoscopy Indications:              Epigastric abdominal pain, Dyspepsia Medicines:                Monitored Anesthesia Care Procedure:                Pre-Anesthesia Assessment:                           - Prior to the procedure, a History and Physical                            was performed, and patient medications and                            allergies were reviewed. The patient's tolerance of                            previous anesthesia was also reviewed. The risks                            and benefits of the procedure and the sedation                            options and risks were discussed with the patient.                            All questions were answered, and informed consent                            was obtained. Prior Anticoagulants: The patient has                            taken no previous anticoagulant or antiplatelet                            agents. ASA Grade Assessment: II - A patient with                            mild systemic disease. After reviewing the risks                            and benefits, the patient was deemed in                            satisfactory condition to undergo the procedure.                           After obtaining informed consent, the endoscope was  passed under direct vision. Throughout the                            procedure, the patient's blood pressure, pulse, and                            oxygen saturations were monitored continuously. The                            Endoscope was introduced through the mouth, and                            advanced to the second part of duodenum. The upper                            GI endoscopy  was accomplished without difficulty.                            The patient tolerated the procedure well. Scope In: Scope Out: Findings:                 LA Grade B (one or more mucosal breaks greater than                            5 mm, not extending between the tops of two mucosal                            folds) esophagitis with no bleeding was found 34 to                            37 cm from the incisors.                           The gastroesophageal flap valve was visualized                            endoscopically and classified as Hill Grade II                            (fold present, opens with respiration).                           The stomach was normal.                           The first portion of the duodenum and second                            portion of the duodenum were normal. Biopsies for                            histology were taken with a cold forceps for  evaluation of celiac disease. Complications:            No immediate complications. Estimated Blood Loss:     Estimated blood loss was minimal. Impression:               - LA Grade B reflux esophagitis with no bleeding.                           - Gastroesophageal flap valve classified as Hill                            Grade II (fold present, opens with respiration).                           - Normal stomach.                           - Normal first portion of the duodenum and second                            portion of the duodenum. Biopsied. Recommendation:           - Patient has a contact number available for                            emergencies. The signs and symptoms of potential                            delayed complications were discussed with the                            patient. Return to normal activities tomorrow.                            Written discharge instructions were provided to the                            patient.                           -  Resume previous diet.                           - Continue present medications.                           - Await pathology results.                           - Return to GI clinic in 6 months. Please call to                            schedule appointment.                           - Use Prilosec (omeprazole) 20 mg PO daily X 90  tabs with 3 refills.                           - Follow an antireflux regimen. Mauri Pole, MD 10/22/2020 10:16:48 AM This report has been signed electronically.

## 2020-10-22 NOTE — Progress Notes (Signed)
Julie Meyer  Pt's states no medical or surgical changes since previsit or office visit.

## 2020-10-22 NOTE — Progress Notes (Signed)
Called to room to assist during endoscopic procedure.  Patient ID and intended procedure confirmed with present staff. Received instructions for my participation in the procedure from the performing physician.  

## 2020-10-22 NOTE — Patient Instructions (Signed)
Please read handouts provided. Continue present medications. Await pathology results. Return to [facility] clinic in 6 months. Please call to schedule an appointment. Begin Prilosec ( omeprazole ) 20 mg daily. Follow an antireflux regimen.      YOU HAD AN ENDOSCOPIC PROCEDURE TODAY AT [facility]:   Refer to the procedure report that was given to you for any specific questions about what was found during the examination.  If the procedure report does not answer your questions, please call your gastroenterologist to clarify.  If you requested that your care partner not be given the details of your procedure findings, then the procedure report has been included in a sealed envelope for you to review at your convenience later.  YOU SHOULD EXPECT: Some feelings of bloating in the abdomen. Passage of more gas than usual.  Walking can help get rid of the air that was put into your GI tract during the procedure and reduce the bloating. If you had a lower endoscopy (such as a colonoscopy or flexible sigmoidoscopy) you may notice spotting of blood in your stool or on the toilet paper. If you underwent a bowel prep for your procedure, you may not have a normal bowel movement for a few days.  Please Note:  You might notice some irritation and congestion in your nose or some drainage.  This is from the oxygen used during your procedure.  There is no need for concern and it should clear up in a day or so.  SYMPTOMS TO REPORT IMMEDIATELY:    Following upper endoscopy (EGD)  Vomiting of blood or coffee ground material  New chest pain or pain under the shoulder blades  Painful or persistently difficult swallowing  New shortness of breath  Fever of 100F or higher  Black, tarry-looking stools  For urgent or emergent issues, a gastroenterologist can be reached at any hour by calling [phone]. Do not use MyChart messaging for urgent concerns.    DIET:  We do recommend a small meal at  first, but then you may proceed to your regular diet.  Drink plenty of fluids but you should avoid alcoholic beverages for 24 hours.  ACTIVITY:  You should plan to take it easy for the rest of today and you should NOT DRIVE or use heavy machinery until tomorrow (because of the sedation medicines used during the test).    FOLLOW UP: Our staff will call the number listed on your records 48-72 hours following your procedure to check on you and address any questions or concerns that you may have regarding the information given to you following your procedure. If we do not reach you, we will leave a message.  We will attempt to reach you two times.  During this call, we will ask if you have developed any symptoms of COVID 19. If you develop any symptoms (ie: fever, flu-like symptoms, shortness of breath, cough etc.) before then, please call [phone].  If you test positive for Covid 19 in the 2 weeks post procedure, please call and report this information to [place].    If any biopsies were taken you will be contacted by phone or by letter within the next 1-3 weeks.  Please call us at [phone] if you have not heard about the biopsies in 3 weeks.    SIGNATURES/CONFIDENTIALITY: You and/or your care partner have signed paperwork which will be entered into your electronic medical record.  These signatures attest to the fact that that the information above  on your After Visit Summary has been reviewed and is understood.  Full responsibility of the confidentiality of this discharge information lies with you and/or your care-partner.

## 2020-10-24 ENCOUNTER — Telehealth: Payer: Self-pay

## 2020-10-24 NOTE — Telephone Encounter (Signed)
  Follow up Call-  Call back number 10/22/2020 03/12/2018  Post procedure Call Back phone  # [phone] cell [phone]  Permission to leave phone message Yes Yes  Some recent data might be hidden     Patient questions:  Do you have a fever, pain , or abdominal swelling? No. Pain Score  0 *  Have you tolerated food without any problems? Yes.    Have you been able to return to your normal activities? Yes.    Do you have any questions about your discharge instructions: Diet   No. Medications  No. Follow up visit  No.  Do you have questions or concerns about your Care? No.  Actions: * If pain score is 4 or above: No action needed, pain <4.  1. Have you developed a fever since your procedure? no  2.   Have you had an respiratory symptoms (SOB or cough) since your procedure? no  3.   Have you tested positive for COVID 19 since your procedure no  4.   Have you had any family members/close contacts diagnosed with the COVID 19 since your procedure?  no   If yes to any of these questions please route to Joylene John, RN and Joella Prince, RN

## 2020-10-26 ENCOUNTER — Telehealth: Payer: Self-pay | Admitting: Gastroenterology

## 2020-10-26 NOTE — Telephone Encounter (Signed)
Inbound call from patient requesting a call back from a nurse please.  States her acid reflux has gotten worse since her procedure.

## 2020-10-26 NOTE — Telephone Encounter (Signed)
Left message on machine to call back  

## 2020-10-29 ENCOUNTER — Encounter: Payer: Self-pay | Admitting: Gastroenterology

## 2020-10-29 NOTE — Telephone Encounter (Signed)
The pt states her reflux seemed to get worse after her EGD.  She started taking pepcid at bedtime and the symptoms got some better.  She is going to increase her her omeprazole to twice daily and wait for the path results.

## 2020-10-30 NOTE — Telephone Encounter (Signed)
Called patient. She is feeling better with Omeprazole 20mg  BID, advised her to continue it for additonal 8 weeks and then taper it once daily. Please schedule follow up visit, next available in 2-3 months. Thanks

## 2020-10-30 NOTE — Telephone Encounter (Signed)
Recall has been entered for follow up office visit with Dr Silverio Decamp

## 2021-01-03 ENCOUNTER — Telehealth: Payer: Self-pay | Admitting: Gastroenterology

## 2021-01-03 NOTE — Telephone Encounter (Signed)
Dr Nandigam Please advise  

## 2021-01-03 NOTE — Telephone Encounter (Signed)
Patient returned call. Stated she have taken omeprazole 20mg  in morning and evening. Pepcid as needed at night to go to bed. Patient have also taken Gaviscon 01/03/21 afternoon.   Patient asked if it would make a difference trying a different acid reflux medication or if she is taking too much that is causing a problem.   Best contact [phone]

## 2021-01-03 NOTE — Telephone Encounter (Signed)
Left message for patient to return call so I can see what she has tried and what she is wanting to take

## 2021-01-04 NOTE — Telephone Encounter (Signed)
Please schedule office visit to discuss medication management so I can over detail potential side effects and how best to control her symptoms. Thanks

## 2021-01-07 ENCOUNTER — Telehealth: Payer: Self-pay | Admitting: Gastroenterology

## 2021-01-07 NOTE — Telephone Encounter (Signed)
Patient reports symptoms of sore throat, heartburn off and on. She does not know of any relation to eating and having her symptoms. She has also started noticing a sensation of fluid in her ear. Not painful, but feeling full.  She is taking Omeprazole twice daily. She is on a low acid meal plan. She takes Gaviscon PRN.  Please advise. Her appointment is 04/10/21.

## 2021-01-07 NOTE — Telephone Encounter (Signed)
Please send Rx for magic mouth wash 5cc every 6 hours as needed X 10 days. Please check if we can bring her in sooner if I have any cancellation or APP? Thanks

## 2021-01-07 NOTE — Telephone Encounter (Signed)
There are not openings on any schedule prior to July

## 2021-01-07 NOTE — Telephone Encounter (Signed)
She already has one scheduled but its the first of September, do you want her to see a PA

## 2021-01-07 NOTE — Telephone Encounter (Signed)
Beth, sent me a msg too, she is working on it. Thanks

## 2021-01-07 NOTE — Telephone Encounter (Signed)
Pt states that she has not been feeling well since her EGD last March, She stated that she is still having bad acid reflux despite having cut off of certain food like chocolate, wine, anything too acidic. She also states that she has sore throat. She is scheduled to see Dr. Silverio Decamp on 9/7 but would like some advise in the meantime.

## 2021-01-08 NOTE — Telephone Encounter (Signed)
ok 

## 2021-01-09 NOTE — Telephone Encounter (Signed)
Spoke with patient. She declines the mouthwash right now. She is trying adjustments with her diet. She understands that it is an option. Offered an appointment 03/01/21. She is unable to move her appointment to that date. [place]

## 2021-04-10 ENCOUNTER — Ambulatory Visit: Payer: 59 | Admitting: Gastroenterology

## 2021-06-04 ENCOUNTER — Other Ambulatory Visit: Payer: 59

## 2021-06-04 ENCOUNTER — Ambulatory Visit (INDEPENDENT_AMBULATORY_CARE_PROVIDER_SITE_OTHER): Payer: 59 | Admitting: Gastroenterology

## 2021-06-04 VITALS — BP 102/74 | HR 74 | Ht 64.5 in | Wt 154.1 lb

## 2021-06-04 DIAGNOSIS — K900 Celiac disease: Secondary | ICD-10-CM

## 2021-06-04 DIAGNOSIS — K219 Gastro-esophageal reflux disease without esophagitis: Secondary | ICD-10-CM

## 2021-06-04 MED ORDER — OMEPRAZOLE 20 MG PO CPDR: 40.0000 mg | DELAYED_RELEASE_CAPSULE | Freq: Every day | ORAL | 3 refills | Status: DC

## 2021-06-04 NOTE — Patient Instructions (Signed)
Your provider has requested that you go to the basement level for lab work before leaving today. Press "B" on the elevator. The lab is located at the first door on the left as you exit the elevator.   We will send your Omeprazole to your pharmacy  Follow up in 1 year   Due to recent changes in healthcare laws, you may see the results of your imaging and laboratory studies on MyChart before your provider has had a chance to review them.  We understand that in some cases there may be results that are confusing or concerning to you. Not all laboratory results come back in the same time frame and the provider may be waiting for multiple results in order to interpret others.  Please give [place] 48 hours in order for your provider to thoroughly review all the results before contacting the office for clarification of your results.    If you are age 14 or older, your body mass index should be between 23-30. Your Body mass index is 26.05 kg/m. If this is out of the aforementioned range listed, please consider follow up with your Primary Care Provider.  If you are age 44 or younger, your body mass index should be between 19-25. Your Body mass index is 26.05 kg/m. If this is out of the aformentioned range listed, please consider follow up with your Primary Care Provider.   ________________________________________________________  The [facility] providers would like to encourage you to use [facility] to communicate with providers for non-urgent requests or questions.  Due to long hold times on the telephone, sending your provider a message by [facility] may be a faster and more efficient way to get a response.  Please allow 48 business hours for a response.  Please remember that this is for non-urgent requests.  _______________________________________________________   Thank you for choosing [facility]  Kavitha Nandigam,MD

## 2021-06-04 NOTE — Progress Notes (Signed)
Julie Meyer    [id number]    01/11/1976  Primary Care Physician:Koirala, Dibas, MD  Referring Physician: Lujean Amel, MD [address] [place],  [place] [address]   Chief complaint:  Celiac disease  HPI:  45 year old very pleasant female with celiac disease here for follow-up for dyspepsia, abdominal discomfort and bloating   Her symptoms have significantly improved with strict gluten-free diet, she continues to have intermittent symptoms whenever she gets accidental exposure to gluten.   She is experiencing more heartburn symptoms after EGD, is taking omeprazole 20 mg daily Denies any vomiting, melena or rectal bleeding.  Overall feeling better since she started taking multivitamin, her B12 level was borderline low at 230   Colonoscopy March 12, 2018: 9 mm polyp [tubular adenoma] removed from ascending colon and 1 mm polyp [sessile serrated adenoma] from transverse colon, internal hemorrhoids otherwise normal exam   Glucose breath test: May 30, 2018 did not show any increase in hydrogen or methane level to suggest small intestinal bacterial overgrowth  EGD 10/22/20 - LA Grade B (one or more mucosal breaks greater than 5 mm, not extending between the tops of two mucosal folds) esophagitis with no bleeding was found 34 to 37 cm from the incisors. - The gastroesophageal flap valve was visualized endoscopically and classified as Hill Grade II (fold present, opens with respiration). - The stomach was normal. - The first portion of the duodenum and second portion of the duodenum were normal. Biopsies for histology were taken with a cold forceps for evaluation of celiac disease.   Outpatient Encounter Medications as of 06/04/2021  Medication Sig   Acetaminophen (TYLENOL 8 HOUR PO) Tylenol 500 mg tablet  Take 1 tablet as needed by oral route.   Alpha-D-Galactosidase (BEANO) TABS Take 1 tablet by mouth daily as needed.   Aspirin (VAZALORE) 81  MG CAPS Take 1 capsule by mouth daily.   DIGESTIVE ENZYMES PO Take 1 tablet by mouth daily.   Elastic Bandages & Supports (RELIEF KNEE) MISC Wear during the day and remove at night.   lactase (LACTAID) 3000 units tablet Take by mouth as needed. Just started yesterday.   levonorgestrel (MIRENA) 20 MCG/24HR IUD by Intrauterine route.   melatonin 3 MG TABS tablet Take 3 mg by mouth at bedtime as needed.   omeprazole (PRILOSEC) 20 MG capsule Take 40 mg by mouth daily.   Peppermint Oil 90 MG CPCR Take 2 capsules by mouth daily as needed.   polyethylene glycol (MIRALAX / GLYCOLAX) packet Take 17 g by mouth daily as needed.   [DISCONTINUED] Al Hyd-Mg Tr-Alg Ac-Sod Bicarb (GAVISCON-2 PO) Take 1 capsule by mouth as needed. (Patient not taking: Reported on 06/04/2021)   [DISCONTINUED] famotidine (PEPCID) 40 MG tablet Pepcid 40 mg tablet  Take 1 tablet every day by oral route as directed. (Patient not taking: Reported on 06/04/2021)   [DISCONTINUED] Magnesium 200 MG TABS Take by mouth daily as needed. (Patient not taking: Reported on 06/04/2021)   [DISCONTINUED] omeprazole (PRILOSEC) 20 MG capsule Take 1 capsule (20 mg total) by mouth daily. (Patient not taking: Reported on 06/04/2021)   [DISCONTINUED] Probiotic Product (PROBIOTIC-10 PO) Take by mouth as needed. Too when taking antibiotic (Patient not taking: Reported on 06/04/2021)   No facility-administered encounter medications on file as of 06/04/2021.    Allergies as of 06/04/2021 - Review Complete 06/04/2021  Allergen Reaction Noted   Estrogens  02/26/2018   Gluten meal  06/17/2018  Past Medical History:  Diagnosis Date   Arthritis    toe   Celiac disease    Clotting disorder ([place])    Has a venous malformation in left foot which causes superficial clots   Gastroparesis    GERD (gastroesophageal reflux disease)    on meds   Gluten intolerance    Nephrolithiasis     Past Surgical History:  Procedure Laterality Date   CHOLECYSTECTOMY      COLONOSCOPY  2019   KN-MAC-suprep(exc)-SS/TA   ear reconstructive     age [age]   MASTOIDECTOMY     age 10   POLYPECTOMY  2019   TA/SS   WISDOM TOOTH EXTRACTION      Family History  Problem Relation Age of Onset   Diabetes Father    Colon polyps Father 59   Uterine cancer Maternal Grandmother    Colon cancer Maternal Aunt 37   Colon polyps Maternal Aunt    Colon polyps Mother 20   Diabetes Brother    Liver cancer Neg Hx    Pancreatic cancer Neg Hx    Rectal cancer Neg Hx    Esophageal cancer Neg Hx    Stomach cancer Neg Hx     Social History   Socioeconomic History   Marital status: Married    Spouse name: Not on file   Number of children: 2   Years of education: Not on file   Highest education level: Not on file  Occupational History   Occupation: Teacher  Tobacco Use   Smoking status: Never   Smokeless tobacco: Never  Vaping Use   Vaping Use: Never used  Substance and Sexual Activity   Alcohol use: Yes    Alcohol/week: 3.0 standard drinks    Types: 3 Standard drinks or equivalent per week   Drug use: Never   Sexual activity: Yes    Birth control/protection: I.U.D.  Other Topics Concern   Not on file  Social History Narrative   Not on file   Social Determinants of Health   Financial Resource Strain: Not on file  Food Insecurity: Not on file  Transportation Needs: Not on file  Physical Activity: Not on file  Stress: Not on file  Social Connections: Not on file  Intimate Partner Violence: Not on file      Review of systems: All other review of systems negative except as mentioned in the HPI.   Physical Exam: Vitals:   06/04/21 1525  BP: 102/74  Pulse: 74  SpO2: 96%   Body mass index is 26.05 kg/m. Gen:      No acute distress HEENT:  sclera anicteric Abd:      soft, non-tender; no palpable masses, no distension Ext:    No edema Neuro: alert and oriented x 3 Psych: normal mood and affect  Data Reviewed:  Reviewed labs, radiology  imaging, old records and pertinent past GI work up   Assessment and Plan/Recommendations:  45 year old very pleasant female with celiac disease, GERD and chronic irritable bowel syndrome with abdominal bloating   Celiac disease: Continue strict gluten-free diet, avoid cross-contamination Informed patient that there is a medication in clinical trial Larazotide that may help with accidental gluten exposure its not approved yet.  Will consider if approved by FDA and has successfully completed the clinical trials Follow-up TTG IgA antibody level   GERD: Continue omeprazole 40 mg daily and antireflux measures   Return in 1 year or sooner if needed   The patient was provided an  opportunity to ask questions and all were answered. The patient agreed with the plan and demonstrated an understanding of the instructions.  Damaris Hippo , MD    CC: Lujean Amel, MD

## 2021-06-05 LAB — TISSUE TRANSGLUTAMINASE, IGA: (tTG) Ab, IgA: <1 U/mL

## 2021-06-07 ENCOUNTER — Encounter: Payer: Self-pay | Admitting: Gastroenterology

## 2021-10-17 ENCOUNTER — Other Ambulatory Visit: Payer: Self-pay | Admitting: Sports Medicine

## 2021-10-17 ENCOUNTER — Ambulatory Visit
Admission: RE | Admit: 2021-10-17 | Discharge: 2021-10-17 | Disposition: A | Discharge status: Home / Self Care | Payer: Managed Care, Other (non HMO) | Source: Ambulatory Visit | Attending: Sports Medicine | Admitting: Sports Medicine

## 2022-01-09 ENCOUNTER — Telehealth: Payer: Self-pay | Admitting: Gastroenterology

## 2022-01-09 NOTE — Telephone Encounter (Addendum)
Called at 2:45 AM Patient of Dr. Woodward Ku with a history of celiac and reflux esophagitis  Having left-sided sore throat with associated odynophagia for 5+ days with symptoms most bothersome in the middle of the night. Did not call the office during daytimes hours "because I wouldn't be able to get an appointment quickly." No fevers, chills, night sweats, URI symptoms, chest past, SOB, radiation of symptoms. There may be associated jaw pain intermittently. She has not seen oral thrush.   EGD 3/22 showed LA B reflux esophagitis. She feels this pain is in a different location from when she had esophagitis.  Taking gaviscon, omeprazole BID, and using more than 40 mg of famotidine daily.  Notes she has been on PPI therapy for 12 years. Following a reflux and celiac free diet. Has previously had the head of her bed elevated but is doing some home renovations and is sleeping in a different bed.   We discussed: - Continuing current medications - Trial of Carafate 1g QID x 14 days - Call the office in the AM to make an appointment to be seen - Call PCP as etiologies not related to esophagus are most likely to be causing her symptoms - Go to the ER with any urgent or progress symptoms or with symptoms that may mimic chest pain  I called the [facility] on [address] [phone] to insure that the Carafate was gluten free. I spoke with the pharmacist and provided a verbal order for 12 days of Carafate.

## 2022-01-10 NOTE — Telephone Encounter (Signed)
Spoke with patient, I advised her the soonest appointment we had was 7/3 with an APP.  Patient stated she was going to see her PCP today for a stomach ache on top of her throat issues and would possibly call back to make an appointment.

## 2022-02-24 ENCOUNTER — Ambulatory Visit: Payer: Commercial Managed Care - HMO | Admitting: Physician Assistant

## 2022-06-24 IMAGING — CR DG LUMBAR SPINE 2-3V
3 series · 3 of 3 positions shown · non-contrast
Comparison: None.

CLINICAL DATA: Lumbar spine pain

EXAM:
LUMBAR SPINE - 2-3 VIEW

[w lumbar spine ap]
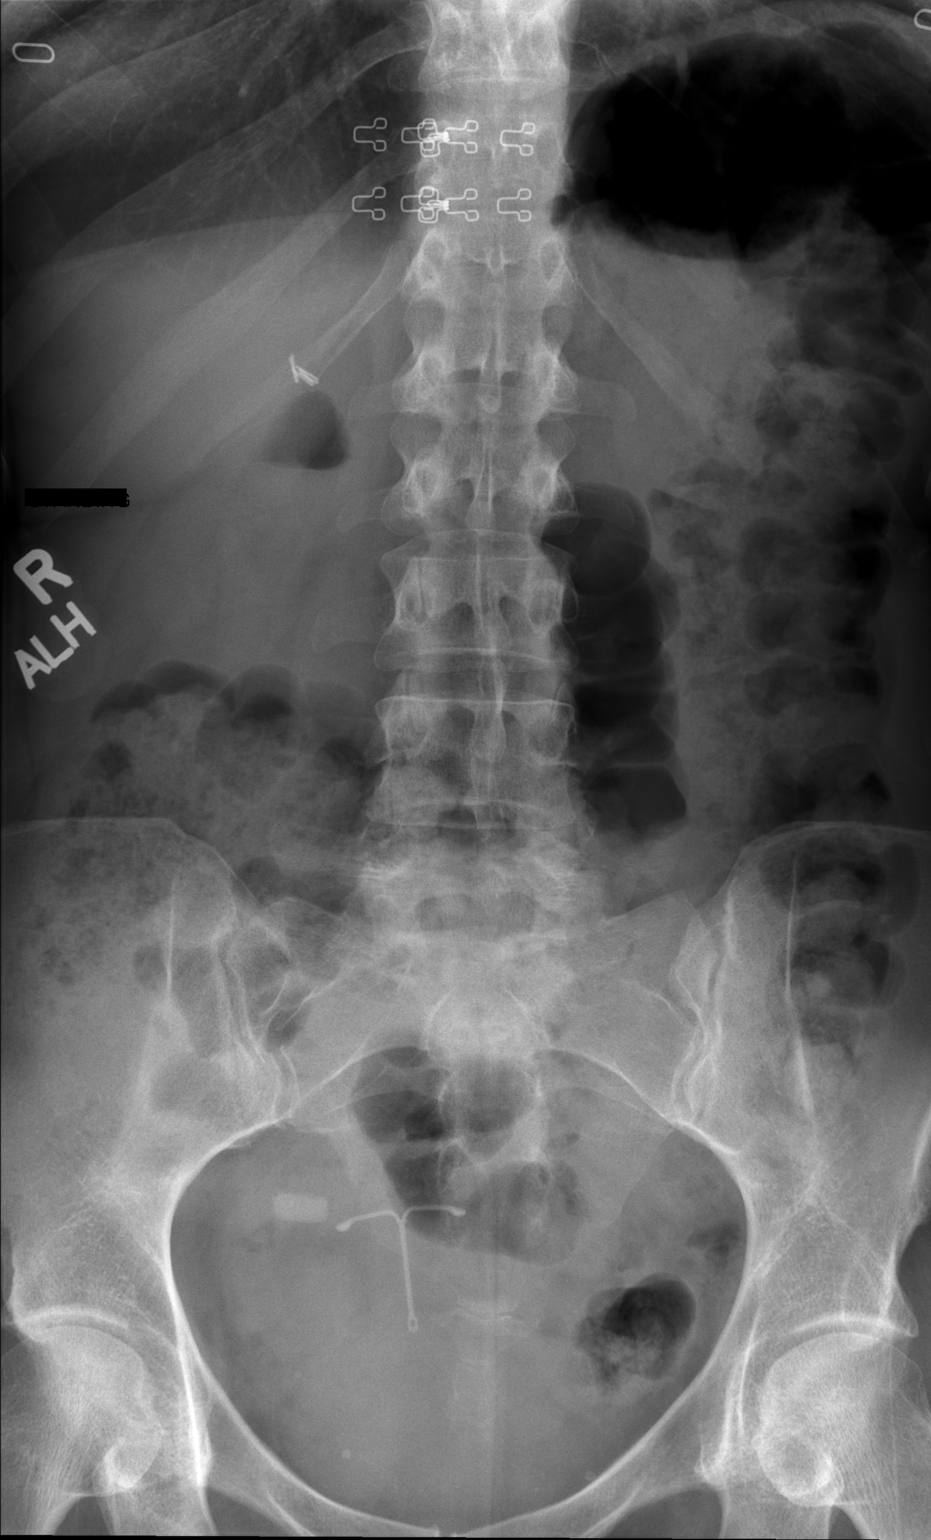

[w lumbar spine lat]
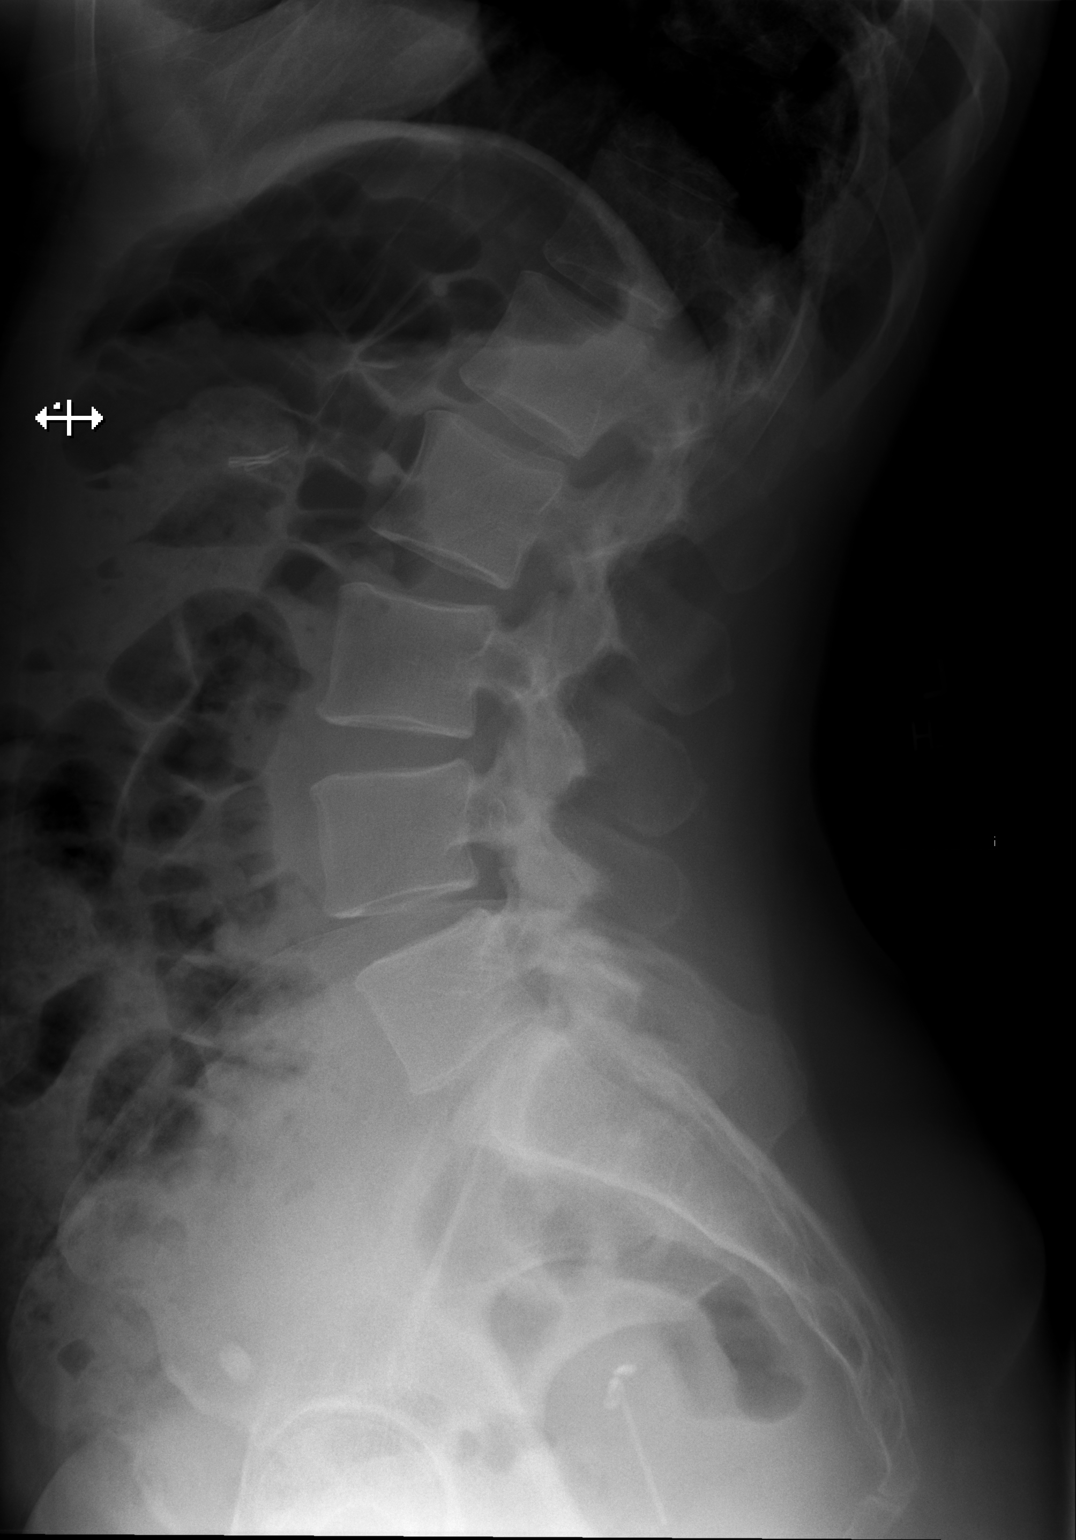

[w lumbar l-5 s-1 spot]
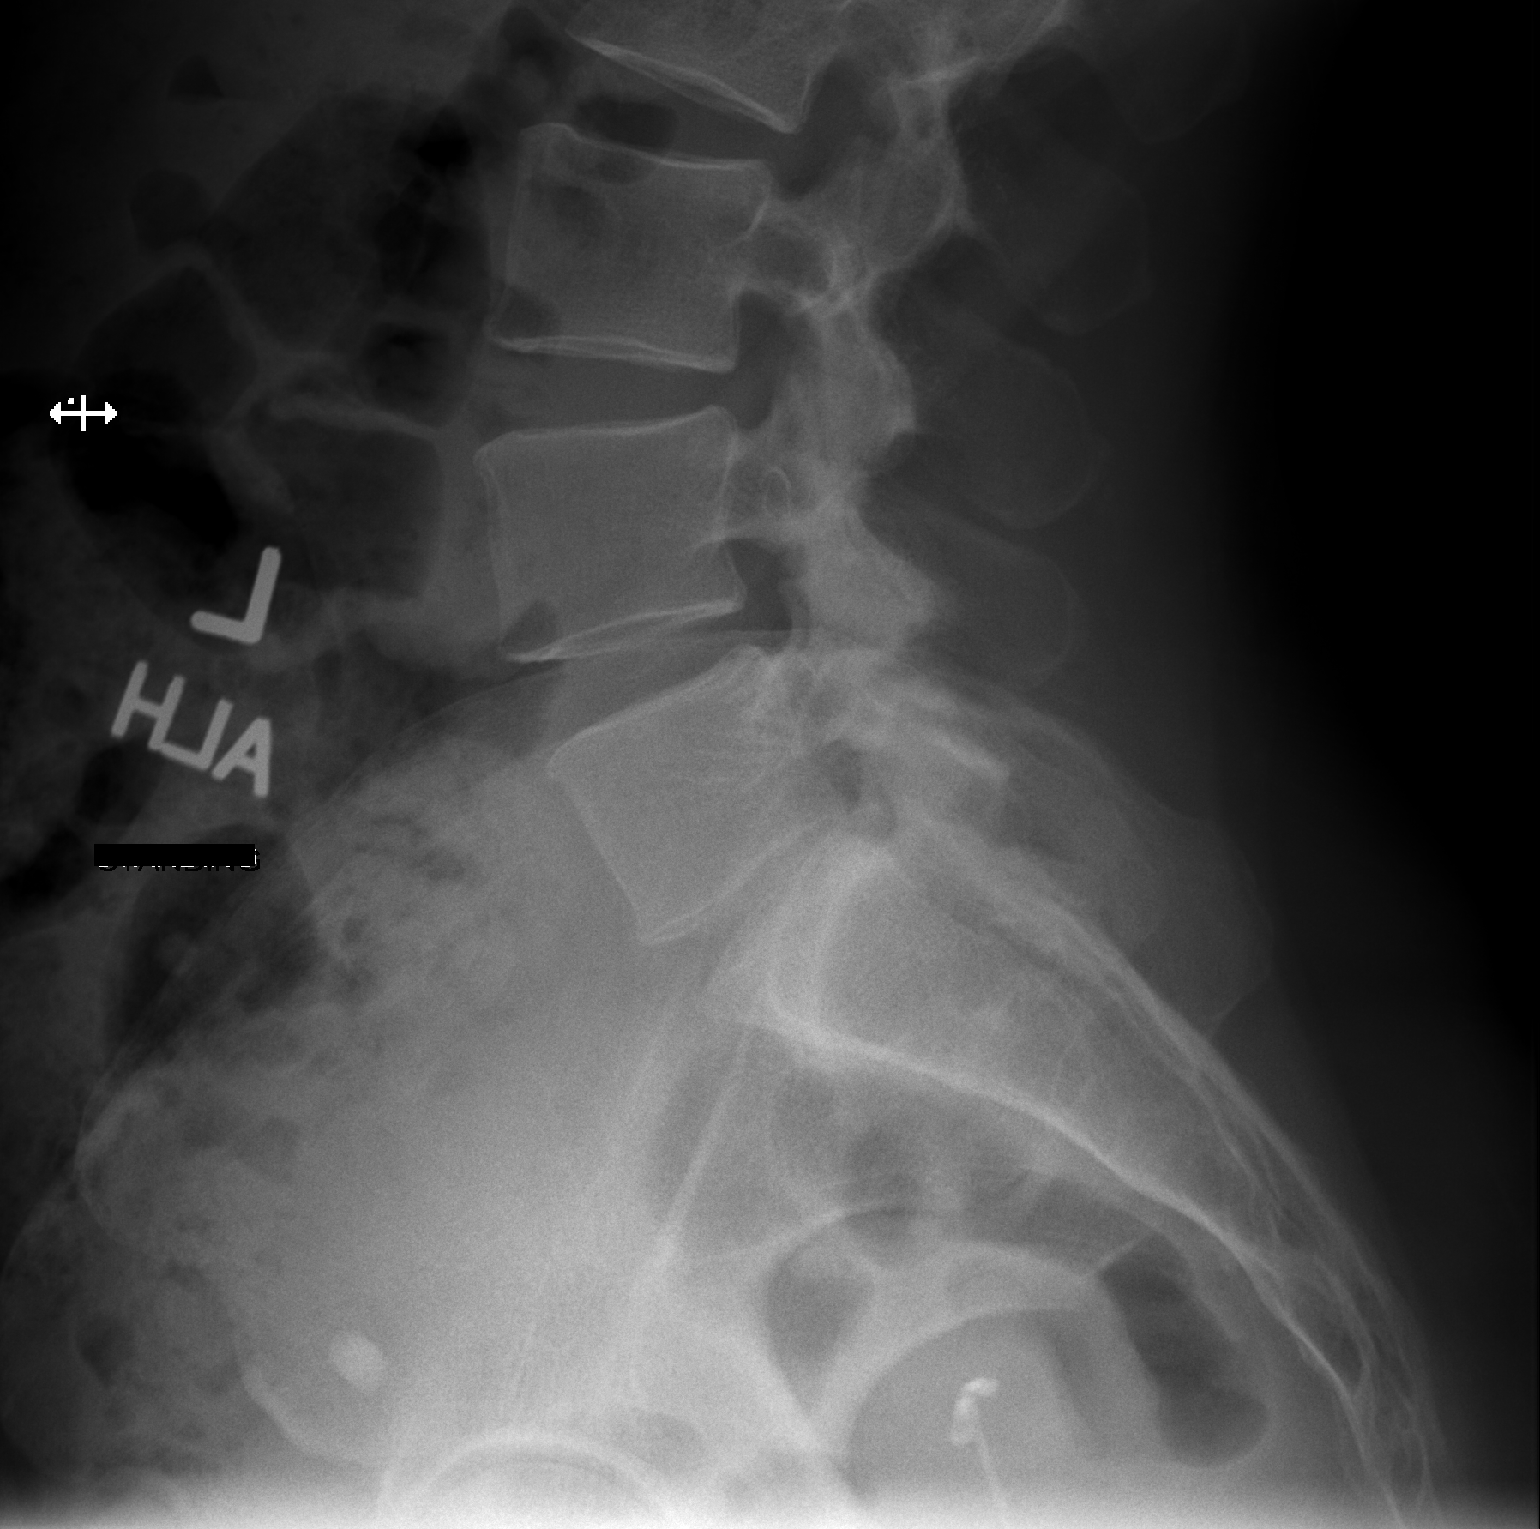

[3 of 3 positions shown; findings below may reference images not displayed]

FINDINGS: Normal alignment. Preserved vertebral body heights. No acute osseous
finding or compression fracture. No focal kyphosis. Disc space
narrowing noted at L5-S1. Suspect minor L4-5 and L5-S1 facet
arthropathy. Normal appearing SI joints. Included pelvis intact. IUD
noted in the right pelvic midline. Remote cholecystectomy. Small
oval radiopacity in the right hemipelvis, suspect ingested pill.
IMPRESSION: No acute finding by plain radiography.

Lower lumbar degenerative changes as above.

## 2022-08-08 DIAGNOSIS — M2550 Pain in unspecified joint: Secondary | ICD-10-CM | POA: Diagnosis not present

## 2022-08-08 DIAGNOSIS — M79646 Pain in unspecified finger(s): Secondary | ICD-10-CM | POA: Diagnosis not present

## 2022-08-11 DIAGNOSIS — M545 Low back pain, unspecified: Secondary | ICD-10-CM | POA: Diagnosis not present

## 2022-08-13 DIAGNOSIS — M79669 Pain in unspecified lower leg: Secondary | ICD-10-CM | POA: Diagnosis not present

## 2022-08-13 DIAGNOSIS — M545 Low back pain, unspecified: Secondary | ICD-10-CM | POA: Diagnosis not present

## 2022-08-15 DIAGNOSIS — R69 Illness, unspecified: Secondary | ICD-10-CM | POA: Diagnosis not present

## 2022-08-15 DIAGNOSIS — F411 Generalized anxiety disorder: Secondary | ICD-10-CM | POA: Diagnosis not present

## 2022-08-15 DIAGNOSIS — F340 Cyclothymic disorder: Secondary | ICD-10-CM | POA: Diagnosis not present

## 2022-09-17 NOTE — Progress Notes (Signed)
09/19/2022 Corbin Ade OX:[id number] 07/18/1976  Referring provider: Lujean Amel, MD Primary GI doctor: Dr. Silverio Decamp  ASSESSMENT AND PLAN:   Rectal bleeding with history of colonic polyps Most likely this is from internal hemorrhoids with worsening constipation, no resolved.  - Increase fiber/ water intake, decrease caffeine, increase activity level. - SITZ bath, cream, schedule for colonoscopy as patient is due 03/2024 -consider banding for hemorrhoids after colonoscopy in the office  Gastroesophageal reflux disease without esophagitis Lifestyle changes discussed, avoid NSAIDS, ETOH Can try PPI every other day with pepcid but reassured patient as long as checking minerals taking PPI daily is safe.  Discussed decrease in oral absorption of certain vitamins and minerals, gets labs with Dr. Rolland Bimler at [place], showed on her phone and appear at goal.   Celiac disease Will check TTG and CBC Discuss DEXA with PCP Symptoms seem well controlled at this time, if TTG elevated or anemia, consider adding EGD   Patient Care Team: Lujean Amel, MD as PCP - General (Family Medicine)  HISTORY OF PRESENT ILLNESS: 47 y.o. female with a past medical history of celiac disease, B12 def, s/p cholecystectomy, IBS, GERD and others listed below presents for evaluation of rectal bleeding.   06/2021 last OV with Dr. Silverio Decamp, improvement in symptoms with gluten free diet, TTG <1.0, on omeprazole, and B12 Presents for follow up and rectal bleeding Maternal aunt with colon cancer 37, and both parents with polyps.  She has been constipated more lately with harder stools, had very large BM about 1-2 weeks ago and had some BRB on TP.  Had some rectal pain at the time.  This has resolved and she has not seen any further bleeding.  Has not been moving as much due to winter, not drinking as much water, no new meds.  States she has been on omeprazole since 2012, was off for a short time and on pepcid but  had grade B esophagitis in it. Expressed some concern with constant PPI use.  No GERD, no nausea, no vomiting, no AB pain, no melena.  04/2022 Labs with Dr. Rolland Bimler at [facility], showed on her phone, no copy, no anemia, Vitamin D 40, normal B12, iron.    Colonoscopy March 12, 2018: 9 mm polyp [tubular adenoma] removed from ascending colon and 1 mm polyp [sessile serrated adenoma] from transverse colon, internal hemorrhoids otherwise normal exam Recall 5 years of 03/2023 Glucose breath test: May 30, 2018 did not show any increase in hydrogen or methane level to suggest small intestinal bacterial overgrowth EGD 10/22/20 - LA Grade B (one or more mucosal breaks greater than 5 mm, not extending between the tops of two mucosal folds) esophagitis with no bleeding was found 34 to 37 cm from the incisors. - The gastroesophageal flap valve was visualized endoscopically and classified as Hill Grade II (fold present, opens with respiration). - The stomach was normal. - The first portion of the duodenum and second portion of the duodenum were normal. Biopsies for histology were taken with a cold forceps for evaluation of celiac disease.  RELEVANT LABS AND IMAGING: Labs at [facility] PCP  She  reports that she has never smoked. She has never used smokeless tobacco. She reports current alcohol use of about 3.0 standard drinks of alcohol per week. She reports that she does not use drugs.  Current Medications:   Current Outpatient Medications (Endocrine & Metabolic):    levonorgestrel (MIRENA) 20 MCG/24HR IUD, by Intrauterine route.   Current Outpatient Medications (Respiratory):  fluticasone (FLONASE ALLERGY RELIEF) 50 MCG/ACT nasal spray, Place 1 spray into both nostrils as needed.  Current Outpatient Medications (Analgesics):    Acetaminophen (TYLENOL 8 HOUR PO), Tylenol 500 mg tablet  Take 1 tablet as needed by oral route.   Aspirin (VAZALORE) 81 MG CAPS, Take 1 capsule by mouth daily.   Current  Outpatient Medications (Other):    DIGESTIVE ENZYMES PO, Take 1 tablet by mouth daily. Has lactaid in it   Elastic Bandages & Supports (RELIEF KNEE) MISC, For the foot   melatonin 3 MG TABS tablet, Take 3 mg by mouth at bedtime as needed.   omeprazole (PRILOSEC) 20 MG capsule, Take 2 capsules (40 mg total) by mouth daily.   Peppermint Oil 90 MG CPCR, Take 2 capsules by mouth daily as needed.  Medical History:  Past Medical History:  Diagnosis Date   Arthritis    toe   Celiac disease    Clotting disorder ([place])    Has a venous malformation in left foot which causes superficial clots   Deviated septum    Gastroparesis    GERD (gastroesophageal reflux disease)    on meds   Gluten intolerance    Nephrolithiasis    Allergies:  Allergies  Allergen Reactions   Estrogens     Clots    Gluten Meal     Other reaction(s): stomach upset Weight loss, stomach cramps, constipation, tingling in left arm and leg.  Weight loss, stomach cramps, constipation, tingling in left arm and leg.     Surgical History:  She  has a past surgical history that includes Cholecystectomy; ear reconstructive; Mastoidectomy; Colonoscopy (2019); Polypectomy (2019); and Wisdom tooth extraction. Family History:  Her family history includes Breast cancer in her mother; Colitis in her mother; Colon cancer (age of onset: 73) in her maternal aunt; Colon polyps in her maternal aunt; Colon polyps (age of onset: 93) in her mother; Colon polyps (age of onset: 17) in her father; Diabetes in her brother and father; Lactose intolerance in her mother; Parkinson's disease in her father; Uterine cancer in her maternal grandmother.  REVIEW OF SYSTEMS  : All other systems reviewed and negative except where noted in the History of Present Illness.  PHYSICAL EXAM: BP 108/66   Pulse 61   Ht 5' 4"$  (1.626 m)   Wt 156 lb 2 oz (70.8 kg)   BMI 26.80 kg/m  General Appearance: Well nourished, in no apparent distress. Head:    Normocephalic and atraumatic. Eyes:  sclerae anicteric,conjunctive pink  Respiratory: Respiratory effort normal, BS equal bilaterally without rales, rhonchi, wheezing. Cardio: RRR with no MRGs. Peripheral pulses intact.  Abdomen: Soft,  Non-distended ,active bowel sounds. No tenderness . No masses. Rectal: declines Musculoskeletal: Full ROM, Normal gait. Without edema. Skin:  Dry and intact without significant lesions or rashes Neuro: Alert and  oriented x4;  No focal deficits. Psych:  Cooperative. Normal mood and affect.    Vladimir Crofts, PA-C 10:32 AM

## 2022-09-19 ENCOUNTER — Ambulatory Visit: Payer: 59 | Admitting: Physician Assistant

## 2022-09-19 ENCOUNTER — Encounter: Payer: Self-pay | Admitting: Physician Assistant

## 2022-09-19 VITALS — BP 108/66 | HR 61 | Ht 64.0 in | Wt 156.1 lb

## 2022-09-19 DIAGNOSIS — K900 Celiac disease: Secondary | ICD-10-CM | POA: Diagnosis not present

## 2022-09-19 DIAGNOSIS — K219 Gastro-esophageal reflux disease without esophagitis: Secondary | ICD-10-CM

## 2022-09-19 DIAGNOSIS — K625 Hemorrhage of anus and rectum: Secondary | ICD-10-CM | POA: Diagnosis not present

## 2022-09-19 DIAGNOSIS — Z86010 Personal history of colonic polyps: Secondary | ICD-10-CM | POA: Diagnosis not present

## 2022-09-19 MED ORDER — NA SULFATE-K SULFATE-MG SULF 17.5-3.13-1.6 GM/177ML PO SOLN: 1.0000 | Freq: Once | ORAL | 0 refills | Status: AC

## 2022-09-19 NOTE — Patient Instructions (Addendum)
You have been scheduled for a colonoscopy. Please follow written instructions given to you at your visit today.  Please pick up your prep supplies at the pharmacy within the next 1-3 days. If you use inhalers (even only as needed), please bring them with you on the day of your procedure.   Your provider has requested that you go to the basement level for lab work before leaving today. Press "B" on the elevator. The lab is located at the first door on the left as you exit the elevator.  Can try PPI every other day with pepcid between.   Please do sitz baths- these can be found at the pharmacy. It is a English as a second language teacher that is put in your toliet.  Please increase fiber or add benefiber, increase water and increase acitivity.  Will send in hydrocoritsone suppository, cheapest with GOODRX from [employer], [employer], [employer] or [employer] if your insurance does not pay for it. If the hemorrhoid suppository sent in is too expensive you can do this over the counter trick.  Apply a pea size amount of over the counter Anusol HC cream to the tip of an over the counter PrepH suppository and insert rectally once every night for at least 7 nights.  If this does not improve there are procedures that can be done.   About Hemorrhoids  Hemorrhoids are swollen veins in the lower rectum and anus.  Also called piles, hemorrhoids are a common problem.  Hemorrhoids may be internal (inside the rectum) or external (around the anus).  Internal Hemorrhoids  Internal hemorrhoids are often painless, but they rarely cause bleeding.  The internal veins may stretch and fall down (prolapse) through the anus to the outside of the body.  The veins may then become irritated and painful.  External Hemorrhoids  External hemorrhoids can be easily seen or felt around the anal opening.  They are under the skin around the anus.  When the swollen veins are scratched or broken by straining, rubbing or wiping they sometimes bleed.  How  Hemorrhoids Occur  Veins in the rectum and around the anus tend to swell under pressure.  Hemorrhoids can result from increased pressure in the veins of your anus or rectum.  Some sources of pressure are:  Straining to have a bowel movement because of constipation Waiting too long to have a bowel movement Coughing and sneezing often Sitting for extended periods of time, including on the toilet Diarrhea Obesity Trauma or injury to the anus Some liver diseases Stress Family history of hemorrhoids Pregnancy  Pregnant women should try to avoid becoming constipated, because they are more likely to have hemorrhoids during pregnancy.  In the last trimester of pregnancy, the enlarged uterus may press on blood vessels and causes hemorrhoids.  In addition, the strain of childbirth sometimes causes hemorrhoids after the birth.  Symptoms of Hemorrhoids  Some symptoms of hemorrhoids include: Swelling and/or a tender lump around the anus Itching, mild burning and bleeding around the anus Painful bowel movements with or without constipation Bright red blood covering the stool, on toilet paper or in the toilet bowel.   Symptoms usually go away within a few days.  Always talk to your doctor about any bleeding to make sure it is not from some other causes.  Diagnosing and Treating Hemorrhoids  Diagnosis is made by an examination by your healthcare provider.  Special test can be performed by your doctor.    Most cases of hemorrhoids can be treated with: High-fiber diet:  Eat more high-fiber foods, which help prevent constipation.  Ask for more detailed fiber information on types and sources of fiber from your healthcare provider. Fluids: Drink plenty of water.  This helps soften bowel movements so they are easier to pass. Sitz baths and cold packs: Sitting in lukewarm water two or three times a day for 15 minutes cleases the anal area and may relieve discomfort.  If the water is too hot, swelling  around the anus will get worse.  Placing a cloth-covered ice pack on the anus for ten minutes four times a day can also help reduce selling.  Gently pushing a prolapsed hemorrhoid back inside after the bath or ice pack can be helpful. Medications: For mild discomfort, your healthcare provider may suggest over-the-counter pain medication or prescribe a cream or ointment for topical use.  The cream may contain witch hazel, zinc oxide or petroleum jelly.  Medicated suppositories are also a treatment option.  Always consult your doctor before applying medications or creams. Procedures and surgeries: There are also a number of procedures and surgeries to shrink or remove hemorrhoids in more serious cases.  Talk to your physician about these options.  You can often prevent hemorrhoids or keep them from becoming worse by maintaining a healthy lifestyle.  Eat a fiber-rich diet of fruits, vegetables and whole grains.  Also, drink plenty of water and exercise regularly.   2007, [employer] Doc.30

## 2022-09-23 DIAGNOSIS — Q279 Congenital malformation of peripheral vascular system, unspecified: Secondary | ICD-10-CM | POA: Diagnosis not present

## 2022-09-25 DIAGNOSIS — R102 Pelvic and perineal pain: Secondary | ICD-10-CM | POA: Diagnosis not present

## 2022-09-25 DIAGNOSIS — Z6826 Body mass index (BMI) 26.0-26.9, adult: Secondary | ICD-10-CM | POA: Diagnosis not present

## 2022-09-25 DIAGNOSIS — Z113 Encounter for screening for infections with a predominantly sexual mode of transmission: Secondary | ICD-10-CM | POA: Diagnosis not present

## 2022-09-25 DIAGNOSIS — Z01411 Encounter for gynecological examination (general) (routine) with abnormal findings: Secondary | ICD-10-CM | POA: Diagnosis not present

## 2022-09-25 DIAGNOSIS — R69 Illness, unspecified: Secondary | ICD-10-CM | POA: Diagnosis not present

## 2022-09-25 DIAGNOSIS — Z30431 Encounter for routine checking of intrauterine contraceptive device: Secondary | ICD-10-CM | POA: Diagnosis not present

## 2022-09-25 DIAGNOSIS — Z124 Encounter for screening for malignant neoplasm of cervix: Secondary | ICD-10-CM | POA: Diagnosis not present

## 2022-09-25 DIAGNOSIS — Z01419 Encounter for gynecological examination (general) (routine) without abnormal findings: Secondary | ICD-10-CM | POA: Diagnosis not present

## 2022-10-13 ENCOUNTER — Encounter: Payer: 59 | Admitting: Gastroenterology

## 2022-11-06 ENCOUNTER — Encounter: Payer: Self-pay | Admitting: Gastroenterology

## 2022-11-17 ENCOUNTER — Encounter: Payer: Self-pay | Admitting: Gastroenterology

## 2022-11-17 ENCOUNTER — Ambulatory Visit (AMBULATORY_SURGERY_CENTER): Payer: 59 | Admitting: Gastroenterology

## 2022-11-17 VITALS — BP 105/65 | HR 70 | Temp 99.1°F | Resp 17 | Ht 64.0 in | Wt 156.0 lb

## 2022-11-17 DIAGNOSIS — D122 Benign neoplasm of ascending colon: Secondary | ICD-10-CM | POA: Diagnosis not present

## 2022-11-17 DIAGNOSIS — Z1211 Encounter for screening for malignant neoplasm of colon: Secondary | ICD-10-CM

## 2022-11-17 DIAGNOSIS — Z09 Encounter for follow-up examination after completed treatment for conditions other than malignant neoplasm: Secondary | ICD-10-CM

## 2022-11-17 DIAGNOSIS — Z86010 Personal history of colonic polyps: Secondary | ICD-10-CM

## 2022-11-17 MED ORDER — SODIUM CHLORIDE 0.9 % IV SOLN: 500.0000 mL | Freq: Once | INTRAVENOUS | Status: DC

## 2022-11-17 NOTE — Progress Notes (Signed)
Called to room to assist during endoscopic procedure.  Patient ID and intended procedure confirmed with present staff. Received instructions for my participation in the procedure from the performing physician.  

## 2022-11-17 NOTE — Progress Notes (Signed)
[facility] History and Physical   Primary Care Physician:  Darrow Bussing, MD   Reason for Procedure:  History of adenomatous colon polyps, incidental small volume rectal bleeding  Plan:    Surveillance colonoscopy with possible interventions as needed     HPI: Julie Meyer is a very pleasant 47 y.o. female here for surveillance colonoscopy. Incidental small volume bright red blood per rectum likely from internal hemorrhoids  The risks and benefits as well as alternatives of endoscopic procedure(s) have been discussed and reviewed. All questions answered. The patient agrees to proceed.    Past Medical History:  Diagnosis Date   Arthritis    toe   Celiac disease    Clotting disorder    Has a venous malformation in left foot which causes superficial clots   Deviated septum    Gastroparesis    GERD (gastroesophageal reflux disease)    on meds   Gluten intolerance    Nephrolithiasis     Past Surgical History:  Procedure Laterality Date   CHOLECYSTECTOMY     COLONOSCOPY  2019   KN-MAC-suprep(exc)-SS/TA   ear reconstructive     age [age]   MASTOIDECTOMY     age 57   POLYPECTOMY  2019   TA/SS   WISDOM TOOTH EXTRACTION      Prior to Admission medications   Medication Sig Start Date End Date Taking? Authorizing Provider  DIGESTIVE ENZYMES PO Take 1 tablet by mouth daily. Has lactaid in it   Yes [provider]  Elastic Bandages & Supports (RELIEF KNEE) MISC For the foot 06/17/18  Yes [provider]  fluticasone (FLONASE ALLERGY RELIEF) 50 MCG/ACT nasal spray Place 1 spray into both nostrils as needed.   Yes [provider]  melatonin 3 MG TABS tablet Take 3 mg by mouth at bedtime as needed.   Yes [provider]  omeprazole (PRILOSEC) 20 MG capsule Take 2 capsules (40 mg total) by mouth daily. 06/04/21  Yes Cali Cuartas, Eleonore Chiquito, MD  Acetaminophen (TYLENOL 8 HOUR PO) Tylenol 500 mg tablet  Take 1 tablet as needed by oral  route.    [provider]  Aspirin (VAZALORE) 81 MG CAPS Take 1 capsule by mouth daily.    [provider]  levonorgestrel (MIRENA) 20 MCG/24HR IUD by Intrauterine route.    [provider]  Peppermint Oil 90 MG CPCR Take 2 capsules by mouth daily as needed.    [provider]    Current Outpatient Medications  Medication Sig Dispense Refill   DIGESTIVE ENZYMES PO Take 1 tablet by mouth daily. Has lactaid in it     Elastic Bandages & Supports (RELIEF KNEE) MISC For the foot     fluticasone (FLONASE ALLERGY RELIEF) 50 MCG/ACT nasal spray Place 1 spray into both nostrils as needed.     melatonin 3 MG TABS tablet Take 3 mg by mouth at bedtime as needed.     omeprazole (PRILOSEC) 20 MG capsule Take 2 capsules (40 mg total) by mouth daily. 90 capsule 3   Acetaminophen (TYLENOL 8 HOUR PO) Tylenol 500 mg tablet  Take 1 tablet as needed by oral route.     Aspirin (VAZALORE) 81 MG CAPS Take 1 capsule by mouth daily.     levonorgestrel (MIRENA) 20 MCG/24HR IUD by Intrauterine route.     Peppermint Oil 90 MG CPCR Take 2 capsules by mouth daily as needed.     No current facility-administered medications for this visit.    Allergies  as of 11/17/2022 - Review Complete 11/17/2022  Allergen Reaction Noted   Estrogens  02/26/2018   Gluten meal  06/17/2018    Family History  Problem Relation Age of Onset   Colon polyps Mother 32   Breast cancer Mother    Colitis Mother        lymphocytic colitis   Lactose intolerance Mother    Diabetes Father    Colon polyps Father 75   Parkinson's disease Father    Diabetes Brother    Uterine cancer Maternal Grandmother    Colon cancer Maternal Aunt 37   Colon polyps Maternal Aunt    Liver cancer Neg Hx    Pancreatic cancer Neg Hx    Rectal cancer Neg Hx    Esophageal cancer Neg Hx    Stomach cancer Neg Hx     Social History   Socioeconomic History   Marital status: Married    Spouse name: Not on file   Number  of children: 2   Years of education: Not on file   Highest education level: Not on file  Occupational History   Occupation: Runner, broadcasting/film/video  Tobacco Use   Smoking status: Never   Smokeless tobacco: Never  Vaping Use   Vaping Use: Never used  Substance and Sexual Activity   Alcohol use: Yes    Alcohol/week: 3.0 standard drinks of alcohol    Types: 3 Standard drinks or equivalent per week   Drug use: Never   Sexual activity: Yes    Birth control/protection: I.U.D.  Other Topics Concern   Not on file  Social History Narrative   Not on file   Social Determinants of Health   Financial Resource Strain: Not on file  Food Insecurity: Not on file  Transportation Needs: Not on file  Physical Activity: Not on file  Stress: Not on file  Social Connections: Not on file  Intimate Partner Violence: Not on file    Review of Systems:  All other review of systems negative except as mentioned in the HPI.  Physical Exam: Vital signs in last 24 hours: Blood Pressure 95/62   Pulse 70   Temperature 99.1 F (37.3 C)   Respiration 19   Height 5\' 4"  (1.626 m)   Weight 156 lb (70.8 kg)   Oxygen Saturation 97%   Body Mass Index 26.78 kg/m  General:   Alert, NAD Lungs:  Clear .   Heart:  Regular rate and rhythm Abdomen:  Soft, nontender and nondistended. Neuro/Psych:  Alert and cooperative. Normal mood and affect. A and O x 3  Reviewed labs, radiology imaging, old records and pertinent past GI work up  Patient is appropriate for planned procedure(s) and anesthesia in an ambulatory setting   K. Scherry Ran , MD [phone]

## 2022-11-17 NOTE — Progress Notes (Signed)
Uneventful anesthetic. Report to pacu rn. Vss. Care resumed by rn. 

## 2022-11-17 NOTE — Patient Instructions (Signed)
Thank you for letting us take care of your healthcare needs today. PLease see handouts given to you on polyps and hemorrhoids.   YOU HAD AN ENDOSCOPIC PROCEDURE TODAY AT THE [facility]:   Refer to the procedure report that was given to you for any specific questions about what was found during the examination.  If the procedure report does not answer your questions, please call your gastroenterologist to clarify.  If you requested that your care partner not be given the details of your procedure findings, then the procedure report has been included in a sealed envelope for you to review at your convenience later.  YOU SHOULD EXPECT: Some feelings of bloating in the abdomen. Passage of more gas than usual.  Walking can help get rid of the air that was put into your GI tract during the procedure and reduce the bloating. If you had a lower endoscopy (such as a colonoscopy or flexible sigmoidoscopy) you may notice spotting of blood in your stool or on the toilet paper. If you underwent a bowel prep for your procedure, you may not have a normal bowel movement for a few days.  Please Note:  You might notice some irritation and congestion in your nose or some drainage.  This is from the oxygen used during your procedure.  There is no need for concern and it should clear up in a day or so.  SYMPTOMS TO REPORT IMMEDIATELY:  Following lower endoscopy (colonoscopy or flexible sigmoidoscopy):  Excessive amounts of blood in the stool  Significant tenderness or worsening of abdominal pains  Swelling of the abdomen that is new, acute  Fever of 100F or higher  For urgent or emergent issues, a gastroenterologist can be reached at any hour by calling [phone]. Do not use MyChart messaging for urgent concerns.    DIET:  We do recommend a small meal at first, but then you may proceed to your regular diet.  Drink plenty of fluids but you should avoid alcoholic beverages for 24  hours.  ACTIVITY:  You should plan to take it easy for the rest of today and you should NOT DRIVE or use heavy machinery until tomorrow (because of the sedation medicines used during the test).    FOLLOW UP: Our staff will call the number listed on your records the next business day following your procedure.  We will call around 7:15- 8:00 am to check on you and address any questions or concerns that you may have regarding the information given to you following your procedure. If we do not reach you, we will leave a message.     If any biopsies were taken you will be contacted by phone or by letter within the next 1-3 weeks.  Please call us at [phone] if you have not heard about the biopsies in 3 weeks.    SIGNATURES/CONFIDENTIALITY: You and/or your care partner have signed paperwork which will be entered into your electronic medical record.  These signatures attest to the fact that that the information above on your After Visit Summary has been reviewed and is understood.  Full responsibility of the confidentiality of this discharge information lies with you and/or your care-partner.

## 2022-11-17 NOTE — Op Note (Signed)
[facility] Patient Name: Julie Meyer Procedure Date: 11/17/2022 9:54 AM MRN: [id number] Endoscopist: Napoleon Form , MD, [id number] Age: 47 Referring MD:  Date of Birth: Jul 04, 1976 Gender: Female Account #: [id number] Procedure:                Colonoscopy Indications:              Surveillance: Personal history of adenomatous                            polyps on last colonoscopy 5 years ago, High risk                            colon cancer surveillance: Personal history of                            adenoma less than 10 mm in size, High risk colon                            cancer surveillance: Personal history of sessile                            serrated colon polyp (less than 10 mm in size) with                            no dysplasia. Incidental small volume rectal                            bleeding likely from hemorrhoids Medicines:                Propofol per Anesthesia Procedure:                Pre-Anesthesia Assessment:                           - Prior to the procedure, a History and Physical                            was performed, and patient medications and                            allergies were reviewed. The patient's tolerance of                            previous anesthesia was also reviewed. The risks                            and benefits of the procedure and the sedation                            options and risks were discussed with the patient.                            All questions were answered, and informed consent  was obtained. Prior Anticoagulants: The patient has                            taken no anticoagulant or antiplatelet agents. ASA                            Grade Assessment: II - A patient with mild systemic                            disease. After reviewing the risks and benefits,                            the patient was deemed in satisfactory condition to                            undergo  the procedure.                           After obtaining informed consent, the colonoscope                            was passed under direct vision. Throughout the                            procedure, the patient's blood pressure, pulse, and                            oxygen saturations were monitored continuously. The                            [id number]-HQ190L Colonoscope [id number] was introduced                            through the anus and advanced to the the cecum,                            identified by appendiceal orifice and ileocecal                            valve. The colonoscopy was performed without                            difficulty. The patient tolerated the procedure                            well. The quality of the bowel preparation was                            excellent. The ileocecal valve, appendiceal                            orifice, and rectum were photographed. Scope In: 10:25:28 AM Scope Out: 10:41:45 AM Scope Withdrawal Time: 0 hours 9 minutes 42 seconds  Total Procedure Duration: 0 hours 16  minutes 17 seconds  Findings:                 The perianal and digital rectal examinations were                            normal.                           A 5 mm polyp was found in the ascending colon. The                            polyp was sessile. The polyp was removed with a                            cold snare. Resection and retrieval were complete.                           Non-bleeding internal hemorrhoids were found during                            retroflexion. The hemorrhoids were small. Complications:            No immediate complications. Estimated Blood Loss:     Estimated blood loss was minimal. Impression:               - One 5 mm polyp in the ascending colon, removed                            with a cold snare. Resected and retrieved.                           - Non-bleeding internal hemorrhoids.                           - The GI Genius  (intelligent endoscopy module),                            computer-aided polyp detection system powered by AI                            was utilized to detect colorectal polyps through                            enhanced visualization during colonoscopy. Recommendation:           - Patient has a contact number available for                            emergencies. The signs and symptoms of potential                            delayed complications were discussed with the                            patient. Return to normal activities tomorrow.  Written discharge instructions were provided to the                            patient.                           - Resume previous diet.                           - Continue present medications.                           - Await pathology results.                           - Repeat colonoscopy in 5-10 years for surveillance                            based on pathology results.                           - Return to GI clinic in 6 months. Napoleon Form, MD 11/17/2022 10:48:41 AM This report has been signed electronically.

## 2022-11-18 ENCOUNTER — Telehealth: Payer: Self-pay | Admitting: *Deleted

## 2022-11-18 NOTE — Progress Notes (Signed)
Reviewed and agree with documentation and assessment and plan. K. Veena Ayomide Purdy , MD   

## 2022-11-18 NOTE — Telephone Encounter (Signed)
  Follow up Call-     11/17/2022    9:46 AM 10/22/2020    9:25 AM  Call back number  Post procedure Call Back phone  # [phone] #[phone] cell  Permission to leave phone message Yes Yes     Patient questions:  Do you have a fever, pain , or abdominal swelling? No. Pain Score  0 *  Have you tolerated food without any problems? Yes.    Have you been able to return to your normal activities? Yes.    Do you have any questions about your discharge instructions: Diet   No. Medications  No. Follow up visit  No.  Do you have questions or concerns about your Care? No.  Actions: * If pain score is 4 or above: No action needed, pain <4.

## 2022-11-25 ENCOUNTER — Encounter: Payer: Self-pay | Admitting: Gastroenterology

## 2023-02-12 DIAGNOSIS — Z1231 Encounter for screening mammogram for malignant neoplasm of breast: Secondary | ICD-10-CM | POA: Diagnosis not present

## 2023-02-12 DIAGNOSIS — Z30432 Encounter for removal of intrauterine contraceptive device: Secondary | ICD-10-CM | POA: Diagnosis not present

## 2023-02-12 DIAGNOSIS — Z30433 Encounter for removal and reinsertion of intrauterine contraceptive device: Secondary | ICD-10-CM | POA: Diagnosis not present

## 2023-03-25 ENCOUNTER — Telehealth: Payer: Self-pay | Admitting: Gastroenterology

## 2023-03-25 NOTE — Telephone Encounter (Signed)
Please advise patient to try enteric-coated low-dose aspirin 81 mg daily, she may be able to tolerate it better than regular low-dose aspirin.  Please schedule next available appointment with me, we can try to bring her in sooner if I have any cancellations

## 2023-03-25 NOTE — Telephone Encounter (Signed)
Patient asks if there is an easy on the stomach prescription blood thinner? She is going to ask her other specialist to prescribe and feels sure she will be asked what you recommend. She states she has been on the EC-ASA before but had issues with it. Thanks

## 2023-03-25 NOTE — Telephone Encounter (Signed)
Patient called stated she has been taking [place] medication and would like to know if there is something similar she can take, thinks it is no longer available. Also would like to follow up with Dr. Lavon Paganini. Was advised there is no openings at this time.

## 2023-03-26 NOTE — Telephone Encounter (Signed)
Ok, I will defer the choice of anticoagulant with PMD/hematology.

## 2023-03-26 NOTE — Telephone Encounter (Signed)
I think I may not have sent this to you. The patient does not feel ASA will work for her, stating she had issues with it in the past. She will ask her doctor for a prescription anti-coagulant. She asks if there is one that is better for her?

## 2023-05-11 DIAGNOSIS — R09A2 Foreign body sensation, throat: Secondary | ICD-10-CM | POA: Diagnosis not present

## 2023-05-11 DIAGNOSIS — Z9089 Acquired absence of other organs: Secondary | ICD-10-CM | POA: Diagnosis not present

## 2023-05-11 DIAGNOSIS — K219 Gastro-esophageal reflux disease without esophagitis: Secondary | ICD-10-CM | POA: Diagnosis not present

## 2023-06-24 DIAGNOSIS — J309 Allergic rhinitis, unspecified: Secondary | ICD-10-CM | POA: Diagnosis not present

## 2023-06-24 DIAGNOSIS — R253 Fasciculation: Secondary | ICD-10-CM | POA: Diagnosis not present

## 2023-06-24 DIAGNOSIS — H6992 Unspecified Eustachian tube disorder, left ear: Secondary | ICD-10-CM | POA: Diagnosis not present

## 2023-07-10 DIAGNOSIS — F340 Cyclothymic disorder: Secondary | ICD-10-CM | POA: Diagnosis not present

## 2023-07-10 DIAGNOSIS — F411 Generalized anxiety disorder: Secondary | ICD-10-CM | POA: Diagnosis not present

## 2023-07-15 DIAGNOSIS — K219 Gastro-esophageal reflux disease without esophagitis: Secondary | ICD-10-CM | POA: Diagnosis not present

## 2023-07-15 DIAGNOSIS — K900 Celiac disease: Secondary | ICD-10-CM | POA: Diagnosis not present

## 2023-07-15 DIAGNOSIS — Z0000 Encounter for general adult medical examination without abnormal findings: Secondary | ICD-10-CM | POA: Diagnosis not present

## 2023-07-15 DIAGNOSIS — E559 Vitamin D deficiency, unspecified: Secondary | ICD-10-CM | POA: Diagnosis not present

## 2023-08-04 DIAGNOSIS — W2209XA Striking against other stationary object, initial encounter: Secondary | ICD-10-CM | POA: Diagnosis not present

## 2023-08-04 DIAGNOSIS — Y9383 Activity, rough housing and horseplay: Secondary | ICD-10-CM | POA: Diagnosis not present

## 2023-08-04 DIAGNOSIS — S0181XA Laceration without foreign body of other part of head, initial encounter: Secondary | ICD-10-CM | POA: Diagnosis not present

## 2023-08-10 DIAGNOSIS — Z4889 Encounter for other specified surgical aftercare: Secondary | ICD-10-CM | POA: Diagnosis not present

## 2023-08-10 DIAGNOSIS — S0990XA Unspecified injury of head, initial encounter: Secondary | ICD-10-CM | POA: Diagnosis not present

## 2023-08-10 DIAGNOSIS — Z4802 Encounter for removal of sutures: Secondary | ICD-10-CM | POA: Diagnosis not present

## 2023-08-26 DIAGNOSIS — M62838 Other muscle spasm: Secondary | ICD-10-CM | POA: Diagnosis not present

## 2023-08-27 DIAGNOSIS — L718 Other rosacea: Secondary | ICD-10-CM | POA: Diagnosis not present

## 2023-08-27 DIAGNOSIS — L821 Other seborrheic keratosis: Secondary | ICD-10-CM | POA: Diagnosis not present

## 2023-08-27 DIAGNOSIS — L918 Other hypertrophic disorders of the skin: Secondary | ICD-10-CM | POA: Diagnosis not present

## 2023-08-27 DIAGNOSIS — L814 Other melanin hyperpigmentation: Secondary | ICD-10-CM | POA: Diagnosis not present

## 2023-08-27 DIAGNOSIS — D225 Melanocytic nevi of trunk: Secondary | ICD-10-CM | POA: Diagnosis not present

## 2023-09-02 DIAGNOSIS — R109 Unspecified abdominal pain: Secondary | ICD-10-CM | POA: Diagnosis not present

## 2023-09-06 NOTE — Therapy (Signed)
OUTPATIENT PHYSICAL THERAPY FEMALE PELVIC EVALUATION   Patient Name: Julie Meyer MRN: [id number] DOB:Jul 11, 1976, 48 y.o., female Today's Date: 09/07/2023  END OF SESSION:  PT End of Session - 09/07/23 1623     Visit Number 1    Authorization Type Aetna 2025  No Auth Required    Authorization Time Period 03/04/2024    PT Start Time 1445    PT Stop Time 1530    PT Time Calculation (min) 45 min    Activity Tolerance Patient tolerated treatment well    Behavior During Therapy WFL for tasks assessed/performed             Past Medical History:  Diagnosis Date   Arthritis    toe   Celiac disease    Clotting disorder (HCC)    Has a venous malformation in left foot which causes superficial clots   Deviated septum    Gastroparesis    GERD (gastroesophageal reflux disease)    on meds   Gluten intolerance    Nephrolithiasis    Past Surgical History:  Procedure Laterality Date   CHOLECYSTECTOMY     COLONOSCOPY  2019   KN-MAC-suprep(exc)-SS/TA   ear reconstructive     age [age]   MASTOIDECTOMY     age 56   POLYPECTOMY  2019   TA/SS   WISDOM TOOTH EXTRACTION     There are no active problems to display for this patient.   PCP: Darrow Bussing, MD  REFERRING PROVIDER: Olivia Mackie, MD  REFERRING DIAG: N39.3 (ICD-10-CM) - Stress incontinence  THERAPY DIAG:  Other lack of coordination  Rationale for Evaluation and Treatment: Rehabilitation  ONSET DATE: recently got worse- 1 year ago  SUBJECTIVE:                                                                                                                                                                                           SUBJECTIVE STATEMENT: Pt reports that she has had some incontinence and would dribble with tennis. She has had some PT for her back She has got an [place] for her IUD, her back was hurting, PT helped. Suspicious of endo. Runs in the family  Fluid intake: Yes:      PAIN:  Are you having  pain? Yes NPRS scale: 5/10 Pain location: Internal, Deep, and Vaginal  Pain type: dull Pain description: intermittent   Aggravating factors: does not know, bloats at times d/t celiac Relieving factors: not eating gluten  PRECAUTIONS: None  RED FLAGS: None   WEIGHT BEARING RESTRICTIONS: No  FALLS:  Has patient fallen in last 6 months? No  LIVING ENVIRONMENT: Lives  with: lives with their family Lives in: House/apartment Stairs: No Has following equipment at home: None  OCCUPATION: stay at home home  PLOF: Independent  PATIENT GOALS: not to leak  PERTINENT HISTORY:  Celiac Gal bladder removal  Sexual abuse: to be asked  BOWEL MOVEMENT: has celiac Pain with bowel movement: No Type of bowel movement:Type (Bristol Stool Scale) 3 Fully empty rectum: Yes:   Leakage: No Pads: No Fiber supplement: No  URINATION: Pain with urination: No Fully empty bladder: Yes:   Stream: Strong Urgency: Yes:   Frequency: yes last week  Leakage: Sneezing and Exercise Pads: No- but reports that she probably should  INTERCOURSE: does not have intercourse very much anymore Pain with intercourse: During Penetration at times Ability to have vaginal penetration:  Yes:   Climax: to be asked Marinoff Scale: 1/3  PREGNANCY: Vaginal deliveries 2 Tearing Yes:   C-section deliveries 0 Currently pregnant No  PROLAPSE: None   OBJECTIVE:  Note: Objective measures were completed at Evaluation unless otherwise noted.    COGNITION: Overall cognitive status: Within functional limits for tasks assessed     SENSATION: Light touch: Appears intact Proprioception: Appears intact  MUSCLE LENGTH: Hamstrings: Right 80 deg; Left 80 deg   LUMBAR SPECIAL TESTS:  Straight leg raise test: Negative   POSTURE: rounded shoulders, forward head, and increased lumbar lordosis  PELVIC ALIGNMENT: seems grossly even  LUMBARAROM/PROM: grossly  within functional limitations   LOWER  EXTREMITY ROM: tight hamstrings  LOWER EXTREMITY MMT: at least 4/5 overall, left foot swelling and pain  PALPATION:   General  able to dem 360 breathing                External Perineal Exam to be assessed                             Internal Pelvic Floor to be assessed  Patient confirms identification and approves PT to assess internal pelvic floor and treatment Yes  PELVIC MMT:   MMT eval  Vaginal   Internal Anal Sphincter   External Anal Sphincter   Puborectalis   Diastasis Recti 1 -2 fingers  (Blank rows = not tested)        TONE: To be assessed, good abdominal tone  PROLAPSE: To be assessed  TODAY'S TREATMENT:                                                                                                                              DATE: 09/07/23   EVAL see below Neuro reed- transverse abdominis breath with bridge Transverse abdominis breath with ball press supine  PATIENT EDUCATION/ there acts:  Education details: relevant anatomy, pressure management, breathing strategies, running strategies Person educated: Patient Education method: Explanation, Demonstration, Tactile cues, Verbal cues, and Handouts Education comprehension: verbalized understanding and needs further education  HOME EXERCISE PROGRAM: [id number]  ASSESSMENT:  CLINICAL IMPRESSION: Patient is a  48 y.o. F who was seen today for physical therapy evaluation and treatment for stress urinary incontinence. She needed VC's and TC's for abdominal and PF coordination, presents with small diastasis and left foot pain, recent weight gain and low back pain, uterine cysts and hx of celiac and bleeding. Hx of pelvic floor muscles tearing and dyspareunia which can all be contributing to SUI.   OBJECTIVE IMPAIRMENTS: decreased coordination, increased fascial restrictions, impaired tone, improper body mechanics, and pain.   ACTIVITY LIMITATIONS: continence and toileting  PARTICIPATION LIMITATIONS: community  activity and exercise  PERSONAL FACTORS: Time since onset of injury/illness/exacerbation and 1 comorbidity: celiac  are also affecting patient's functional outcome.   REHAB POTENTIAL: Good  CLINICAL DECISION MAKING: Stable/uncomplicated  EVALUATION COMPLEXITY: Low   GOALS: Goals reviewed with patient? Yes  SHORT TERM GOALS: Target date: 10/05/2023    Pt will be I with initial HEP Baseline: Goal status: INITIAL  2.  Pt will report no pain left foot with HEP Baseline:  Goal status: INITIAL  3.  Pt will report I with strategies for dyspareunia Baseline:  Goal status: INITIAL   LONG TERM GOALS: Target date: 03/04/2024  Pt will report 0/ 10 pain with intercourse Baseline:  Goal status: INITIAL  2.  Pt will soak 0 underwear/ day Baseline:  Goal status: INITIAL  3.  Pt will be I with advanced HEP Baseline:  Goal status: INITIAL  4.  Pt will be I with healthy bladder strategies Baseline:  Goal status: INITIAL  PLAN:  PT FREQUENCY: 1-2x/week  PT DURATION: 6 months  PLANNED INTERVENTIONS: 97110-Therapeutic exercises, 97530- Therapeutic activity, 97112- Neuromuscular re-education, 97535- Self Care, [id number]- Manual therapy, Taping, Dry Needling, Joint mobilization, Joint manipulation, Spinal manipulation, Spinal mobilization, Scar mobilization, and Biofeedback  PLAN FOR NEXT SESSION: internal pelvic floor assessment  Santos Hardwick, PT 09/07/23 4:28 PM

## 2023-09-07 ENCOUNTER — Ambulatory Visit: Payer: 59 | Attending: Obstetrics and Gynecology | Admitting: Physical Therapy

## 2023-09-07 ENCOUNTER — Encounter: Payer: Self-pay | Admitting: Physical Therapy

## 2023-09-07 ENCOUNTER — Other Ambulatory Visit: Payer: Self-pay

## 2023-09-07 DIAGNOSIS — R278 Other lack of coordination: Secondary | ICD-10-CM | POA: Diagnosis not present

## 2023-09-09 DIAGNOSIS — R079 Chest pain, unspecified: Secondary | ICD-10-CM | POA: Diagnosis not present

## 2023-09-09 DIAGNOSIS — R0789 Other chest pain: Secondary | ICD-10-CM | POA: Diagnosis not present

## 2023-09-22 ENCOUNTER — Ambulatory Visit: Payer: 59 | Admitting: Physical Therapy

## 2023-09-22 ENCOUNTER — Encounter: Payer: Self-pay | Admitting: Physical Therapy

## 2023-09-22 DIAGNOSIS — R278 Other lack of coordination: Secondary | ICD-10-CM | POA: Diagnosis not present

## 2023-09-22 NOTE — Therapy (Signed)
OUTPATIENT PHYSICAL THERAPY FEMALE PELVIC TREATMENT   Patient Name: Julie Meyer MRN: [id number] DOB:Jan 21, 1976, 48 y.o., female Today's Date: 09/22/2023  END OF SESSION:  PT End of Session - 09/22/23 1013     Visit Number 2    Authorization Type Aetna 2025  No Auth Required    Authorization Time Period 03/04/2024    PT Start Time 0937    PT Stop Time 1015    PT Time Calculation (min) 38 min    Activity Tolerance Patient tolerated treatment well    Behavior During Therapy WFL for tasks assessed/performed              Past Medical History:  Diagnosis Date   Arthritis    toe   Celiac disease    Clotting disorder (HCC)    Has a venous malformation in left foot which causes superficial clots   Deviated septum    Gastroparesis    GERD (gastroesophageal reflux disease)    on meds   Gluten intolerance    Nephrolithiasis    Past Surgical History:  Procedure Laterality Date   CHOLECYSTECTOMY     COLONOSCOPY  2019   KN-MAC-suprep(exc)-SS/TA   ear reconstructive     age [age]   MASTOIDECTOMY     age 54   POLYPECTOMY  2019   TA/SS   WISDOM TOOTH EXTRACTION     There are no active problems to display for this patient.   PCP: Darrow Bussing, MD  REFERRING PROVIDER: Olivia Mackie, MD  REFERRING DIAG: N39.3 (ICD-10-CM) - Stress incontinence  THERAPY DIAG:  Other lack of coordination  Rationale for Evaluation and Treatment: Rehabilitation  ONSET DATE: recently got worse- 1 year ago  SUBJECTIVE:                                                                                                                                                                                           SUBJECTIVE STATEMENT: Pt reports that she had a [place] less leaking overall. Some at the end of the game.  Got results from her [place], had cyst on her ovary and fluid in the cul de sac Had some low back pain with tennis Still suspicious of endo.  Might look for a second opinion. Has an  IUD.  Has some weird spotting  Fluid intake: Yes:      PAIN:  Are you having pain? Yes NPRS scale: 5/10 Pain location: Internal, Deep, and Vaginal  Pain type: dull Pain description: intermittent   Aggravating factors: does not know, bloats at times d/t celiac Relieving factors: not eating gluten  PRECAUTIONS: None  RED FLAGS: None  WEIGHT BEARING RESTRICTIONS: No  FALLS:  Has patient fallen in last 6 months? 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  PLOF: Independent  PATIENT GOALS: not to leak  PERTINENT HISTORY:  Celiac Gal bladder removal  Sexual abuse: no  BOWEL MOVEMENT: has celiac Pain with bowel movement: No Type of bowel movement:Type (Bristol Stool Scale) 3 Fully empty rectum: Yes:   Leakage: No Pads: No Fiber supplement: No  URINATION: Pain with urination: No Fully empty bladder: Yes:   Stream: Strong Urgency: Yes:   Frequency: yes last week  Leakage: Sneezing and Exercise Pads: No- but reports that she probably should  INTERCOURSE: does not have intercourse very much anymore Pain with intercourse: During Penetration at times Ability to have vaginal penetration:  Yes:   Climax: to be asked Marinoff Scale: 1/3  PREGNANCY: Vaginal deliveries 2 Tearing Yes:   C-section deliveries 0 Currently pregnant No  PROLAPSE: None   OBJECTIVE:  Note: Objective measures were completed at Evaluation unless otherwise noted.    COGNITION: Overall cognitive status: Within functional limits for tasks assessed     SENSATION: Light touch: Appears intact Proprioception: Appears intact  MUSCLE LENGTH: Hamstrings: Right 80 deg; Left 80 deg   LUMBAR SPECIAL TESTS:  Straight leg raise test: Negative   POSTURE: rounded shoulders, forward head, and increased lumbar lordosis  PELVIC ALIGNMENT: seems grossly  even  LUMBARAROM/PROM: grossly  within functional limitations   LOWER EXTREMITY ROM: tight hamstrings  LOWER EXTREMITY MMT: at least 4/5 overall, left foot swelling and pain  PALPATION:   General  able to dem 360 breathing                External Perineal Exam to be assessed                             Internal Pelvic Floor to be assessed  Patient confirms identification and approves PT to assess internal pelvic floor and treatment Yes  PELVIC MMT:   MMT eval  Vaginal   Internal Anal Sphincter   External Anal Sphincter   Puborectalis   Diastasis Recti 1 -2 fingers  (Blank rows = not tested)        TONE: To be assessed, good abdominal tone  PROLAPSE: To be assessed  TODAY'S TREATMENT:                                                                                                                              DATE: 09/22/23   EVAL see below Neuro reed- transverse abdominis breath with bridge Transverse abdominis breath with ball press supine and seated  There acts- review of symptoms, HEP PATIENT EDUCATION/ there acts:  Education details: relevant anatomy, pressure management, breathing strategies, running strategies Person educated: Patient Education method: Explanation, Demonstration, Tactile cues,  Verbal cues, and Handouts Education comprehension: verbalized understanding and needs further education  HOME EXERCISE PROGRAM: [id number]  ASSESSMENT:  CLINICAL IMPRESSION: Pt did well with her exercises. VC's and TC's for breath holding strategies     OBJECTIVE IMPAIRMENTS: decreased coordination, increased fascial restrictions, impaired tone, improper body mechanics, and pain.   ACTIVITY LIMITATIONS: continence and toileting  PARTICIPATION LIMITATIONS: community activity and exercise  PERSONAL FACTORS: Time since onset of injury/illness/exacerbation and 1 comorbidity: celiac  are also affecting patient's functional outcome.   REHAB POTENTIAL:  Good  CLINICAL DECISION MAKING: Stable/uncomplicated  EVALUATION COMPLEXITY: Low   GOALS: Goals reviewed with patient? Yes  SHORT TERM GOALS: Target date: 10/05/2023    Pt will be I with initial HEP Baseline: Goal status: INITIAL  2.  Pt will report no pain left foot with HEP Baseline:  Goal status: INITIAL  3.  Pt will report I with strategies for dyspareunia Baseline:  Goal status: INITIAL   LONG TERM GOALS: Target date: 03/04/2024  Pt will report 0/ 10 pain with intercourse Baseline:  Goal status: INITIAL  2.  Pt will soak 0 underwear/ day Baseline:  Goal status: INITIAL  3.  Pt will be I with advanced HEP Baseline:  Goal status: INITIAL  4.  Pt will be I with healthy bladder strategies Baseline:  Goal status: INITIAL  PLAN:  PT FREQUENCY: 1-2x/week  PT DURATION: 6 months  PLANNED INTERVENTIONS: 97110-Therapeutic exercises, 97530- Therapeutic activity, 97112- Neuromuscular re-education, 97535- Self Care, 28413- Manual therapy, Taping, Dry Needling, Joint mobilization, Joint manipulation, Spinal manipulation, Spinal mobilization, Scar mobilization, and Biofeedback  PLAN FOR NEXT SESSION: internal pelvic floor assessment, exercises  Andee Chivers, PT 09/22/23 10:15 AM

## 2023-09-29 ENCOUNTER — Ambulatory Visit: Payer: 59 | Admitting: Physical Therapy

## 2023-09-29 ENCOUNTER — Encounter: Payer: Self-pay | Admitting: Physical Therapy

## 2023-09-29 DIAGNOSIS — R278 Other lack of coordination: Secondary | ICD-10-CM | POA: Diagnosis not present

## 2023-09-29 NOTE — Therapy (Signed)
 OUTPATIENT PHYSICAL THERAPY FEMALE PELVIC TREATMENT   Patient Name: Julie Meyer MRN: [id number] DOB:[birth year], 48 y.o., female Today's Date: 09/29/2023  END OF SESSION:  PT End of Session - 09/29/23 0939     Visit Number 3    Authorization Type [employer] [id number]  No Auth Required    Authorization Time Period 03/04/2024    PT Start Time 0938    PT Stop Time 1015    PT Time Calculation (min) 37 min    Activity Tolerance Patient tolerated treatment well    Behavior During Therapy [facility] for tasks assessed/performed               Past Medical History:  Diagnosis Date   Arthritis    toe   Celiac disease    Clotting disorder (HCC)    Has a venous malformation in left foot which causes superficial clots   Deviated septum    Gastroparesis    GERD (gastroesophageal reflux disease)    on meds   Gluten intolerance    Nephrolithiasis    Past Surgical History:  Procedure Laterality Date   CHOLECYSTECTOMY     COLONOSCOPY  2019   KN-MAC-suprep(exc)-SS/TA   ear reconstructive     age [age]   MASTOIDECTOMY     age 38   POLYPECTOMY  2019   TA/SS   WISDOM TOOTH EXTRACTION     There are no active problems to display for this patient.   PCP: Darrow Bussing, MD  REFERRING PROVIDER: Olivia Mackie, MD  REFERRING DIAG: N39.3 (ICD-10-CM) - Stress incontinence  THERAPY DIAG:  Other lack of coordination  Rationale for Evaluation and Treatment: Rehabilitation  ONSET DATE: recently got worse- 1 year ago  SUBJECTIVE:                                                                                                                                                                                           SUBJECTIVE STATEMENT: Pt reports that it seems like she has less leaking, hard to know, has not been playing much tennis. Used to have stronger urgency Had a fresca last night, gave her some bladder pain at night from that Has not had intercourse d/t husband being sick   Fluid  intake: Yes:      PAIN:  Are you having pain? Yes NPRS scale: 5/10 Pain location: Internal, Deep, and Vaginal  Pain type: dull Pain description: intermittent   Aggravating factors: does not know, bloats at times d/t celiac Relieving factors: not eating gluten  PRECAUTIONS: None  RED FLAGS: None   WEIGHT BEARING RESTRICTIONS: No  FALLS:  Has patient fallen in last  6 months? 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  PLOF: Independent  PATIENT GOALS: not to leak  PERTINENT HISTORY:  Celiac Gal bladder removal  Sexual abuse: no  BOWEL MOVEMENT: has celiac Pain with bowel movement: No Type of bowel movement:Type (Bristol Stool Scale) 3 Fully empty rectum: Yes:   Leakage: No Pads: No Fiber supplement: No  URINATION: Pain with urination: No Fully empty bladder: Yes:   Stream: Strong Urgency: Yes:   Frequency: yes last week  Leakage: Sneezing and Exercise Pads: No- but reports that she probably should  INTERCOURSE: does not have intercourse very much anymore Pain with intercourse: During Penetration at times Ability to have vaginal penetration:  Yes:   Climax: to be asked Marinoff Scale: 1/3  PREGNANCY: Vaginal deliveries 2 Tearing Yes:   C-section deliveries 0 Currently pregnant No  PROLAPSE: None   OBJECTIVE:  Note: Objective measures were completed at Evaluation unless otherwise noted.    COGNITION: Overall cognitive status: Within functional limits for tasks assessed     SENSATION: Light touch: Appears intact Proprioception: Appears intact  MUSCLE LENGTH: Hamstrings: Right 80 deg; Left 80 deg   LUMBAR SPECIAL TESTS:  Straight leg raise test: Negative   POSTURE: rounded shoulders, forward head, and increased lumbar lordosis  PELVIC ALIGNMENT: seems grossly even  LUMBARAROM/PROM: grossly  within  functional limitations   LOWER EXTREMITY ROM: tight hamstrings  LOWER EXTREMITY MMT: at least 4/5 overall, left foot swelling and pain  PALPATION:   General  able to dem 360 breathing                External Perineal Exam to be assessed                             Internal Pelvic Floor to be assessed  Patient confirms identification and approves PT to assess internal pelvic floor and treatment no  PELVIC MMT:   MMT eval  Vaginal   Internal Anal Sphincter   External Anal Sphincter   Puborectalis   Diastasis Recti 1 -2 fingers  (Blank rows = not tested)        TONE: To be assessed, good abdominal tone  PROLAPSE: To be assessed  TODAY'S TREATMENT:                                                                                                                              DATE: 09/29/23   EVAL see below Neuro reed- transverse abdominis breath with bridge Transverse abdominis breath with ball press supine and seated Side stepping  Shoulder extensions with green theraband  There acts- review of symptoms, HEP, bladder irritants, bladder diary  PATIENT EDUCATION/ there acts:  Education details: relevant anatomy, pressure management, breathing strategies, running strategies Person educated: Patient Education method: Explanation, Demonstration, Tactile  cues, Verbal cues, and Handouts Education comprehension: verbalized understanding and needs further education  HOME EXERCISE PROGRAM: [id number]  ASSESSMENT:  CLINICAL IMPRESSION: Pt did well with her exercises. Pt to fill out bladder diary. Did not want to address pain with intercourse today  Pt educated on softer step and neutral posture with tennis as well as using transverse abdominis breath with shoulder extensions to minimize leaking.   OBJECTIVE IMPAIRMENTS: decreased coordination, increased fascial restrictions, impaired tone, improper body mechanics, and pain.   ACTIVITY LIMITATIONS: continence and  toileting  PARTICIPATION LIMITATIONS: community activity and exercise  PERSONAL FACTORS: Time since onset of injury/illness/exacerbation and 1 comorbidity: celiac  are also affecting patient's functional outcome.   REHAB POTENTIAL: Good  CLINICAL DECISION MAKING: Stable/uncomplicated  EVALUATION COMPLEXITY: Low   GOALS: Goals reviewed with patient? Yes  SHORT TERM GOALS: Target date: 10/05/2023    Pt will be I with initial HEP Baseline: Goal status: INITIAL  2.  Pt will report no pain left foot with HEP Baseline:  Goal status: INITIAL  3.  Pt will report I with strategies for dyspareunia Baseline:  Goal status: INITIAL   LONG TERM GOALS: Target date: 03/04/2024  Pt will report 0/ 10 pain with intercourse Baseline:  Goal status: INITIAL  2.  Pt will soak 0 underwear/ day Baseline:  Goal status: INITIAL  3.  Pt will be I with advanced HEP Baseline:  Goal status: INITIAL  4.  Pt will be I with healthy bladder strategies Baseline:  Goal status: progressing  PLAN:  PT FREQUENCY: 1-2x/week  PT DURATION: 6 months  PLANNED INTERVENTIONS: 97110-Therapeutic exercises, 97530- Therapeutic activity, 97112- Neuromuscular re-education, 97535- Self Care, 54098- Manual therapy, Taping, Dry Needling, Joint mobilization, Joint manipulation, Spinal manipulation, Spinal mobilization, Scar mobilization, and Biofeedback  PLAN FOR NEXT SESSION: internal pelvic floor assessment, exercises  Delbert Darley, PT 09/29/23 9:56 AM

## 2023-10-01 DIAGNOSIS — Q279 Congenital malformation of peripheral vascular system, unspecified: Secondary | ICD-10-CM | POA: Diagnosis not present

## 2023-11-09 DIAGNOSIS — Z6826 Body mass index (BMI) 26.0-26.9, adult: Secondary | ICD-10-CM | POA: Diagnosis not present

## 2023-11-09 DIAGNOSIS — L2389 Allergic contact dermatitis due to other agents: Secondary | ICD-10-CM | POA: Diagnosis not present

## 2023-11-11 DIAGNOSIS — Z6826 Body mass index (BMI) 26.0-26.9, adult: Secondary | ICD-10-CM | POA: Diagnosis not present

## 2023-11-11 DIAGNOSIS — R5383 Other fatigue: Secondary | ICD-10-CM | POA: Diagnosis not present

## 2023-11-19 ENCOUNTER — Encounter: Payer: Self-pay | Admitting: Neurology

## 2023-11-27 DIAGNOSIS — F340 Cyclothymic disorder: Secondary | ICD-10-CM | POA: Diagnosis not present

## 2023-11-27 DIAGNOSIS — F411 Generalized anxiety disorder: Secondary | ICD-10-CM | POA: Diagnosis not present

## 2023-11-27 DIAGNOSIS — F4310 Post-traumatic stress disorder, unspecified: Secondary | ICD-10-CM | POA: Diagnosis not present

## 2023-12-01 DIAGNOSIS — L820 Inflamed seborrheic keratosis: Secondary | ICD-10-CM | POA: Diagnosis not present

## 2024-01-17 DIAGNOSIS — R5383 Other fatigue: Secondary | ICD-10-CM | POA: Diagnosis not present

## 2024-01-17 DIAGNOSIS — Z6826 Body mass index (BMI) 26.0-26.9, adult: Secondary | ICD-10-CM | POA: Diagnosis not present

## 2024-01-17 DIAGNOSIS — R110 Nausea: Secondary | ICD-10-CM | POA: Diagnosis not present

## 2024-01-20 DIAGNOSIS — R110 Nausea: Secondary | ICD-10-CM | POA: Diagnosis not present

## 2024-01-22 NOTE — Progress Notes (Signed)
 Initial neurology clinic note  Reason for Evaluation: Consultation requested by Kip Righter, MD for an opinion regarding neck pain, muscle spasms. My final recommendations will be communicated back to the requesting physician by way of shared medical record or letter to requesting physician via [place]  mail.  HPI: This is Ms. Julie Meyer, a 48 y.o. right-handed female with a medical history of celiac disease, venous malformation in foot (at birth, now on xarelto) who presents to neurology clinic with the chief complaint of head spasms. The patient is alone today.  Patient first noticed symptoms in 06/2023 with right sided head muscle twitch. She mentioned to this to her PCP who asked her to record symptoms and log foods. She has not noticed any correlation. This improved in June, particularly while at the beach. She plays tennis and noticed lately that it will occur in the heat sometimes. The only thing she mentions is that she has noticed some neck tightness on the right during the symptoms. It is not significant. She denies significant pain but the muscle twitching is annoying. Overall, symptoms have improved from prior. It maybe occurs once per week lately.  She has some GI pains for which she is trying to see GI. This will cause poor sleep. She will occasionally have head pain, but this has not been significant lately. She may have more spasms when sleeping poorly. She denies recent significant stress. Of note, patient mentions she has started snoring, but has no known history of OSA.  She will occasionally get a lip or eyelid spasm.   She was given baclofen for a short period that maybe helped a Gotto, but did not get rid of symptoms. She only took it for a month.  She report any constitutional symptoms like fever, night sweats, anorexia or unintentional weight loss. She has lost a Dokes weight due to recent stomach issues.  EtOH use: 1-2 per week (wine) Caffeine use: 1-2 cups of coffee   Restrictive diet? Gluten free Family history of neuropathy/myopathy/NM disease? Father sees Dr. Evonnie for PD, paternal grandfather had a brain tumor. Multiple siblings of father had dementia and brain aneurysm.   MEDICATIONS:  Outpatient Encounter Medications as of 02/03/2024  Medication Sig Note   Acetaminophen (TYLENOL 8 HOUR PO) Tylenol 500 mg tablet  Take 1 tablet as needed by oral route.    DIGESTIVE ENZYMES PO Take 1 tablet by mouth daily. Has lactaid in it    Elastic Bandages & Supports (RELIEF KNEE) MISC For the foot    fluticasone (FLONASE ALLERGY RELIEF) 50 MCG/ACT nasal spray Place 1 spray into both nostrils as needed.    levonorgestrel  (MIRENA ) 20 MCG/24HR IUD by Intrauterine route. 10/22/2020: Placed 5 1/2 years ago   melatonin 3 MG TABS tablet Take 3 mg by mouth at bedtime as needed.    omeprazole  (PRILOSEC) 20 MG capsule Take 2 capsules (40 mg total) by mouth daily.    rivaroxaban (XARELTO) 10 MG TABS tablet Take 2.5 mg by mouth daily.    Aspirin (VAZALORE) 81 MG CAPS Take 1 capsule by mouth daily.    Peppermint Oil 90 MG CPCR Take 2 capsules by mouth daily as needed. (Patient not taking: Reported on 02/03/2024)    No facility-administered encounter medications on file as of 02/03/2024.    PAST MEDICAL HISTORY: Past Medical History:  Diagnosis Date   Arthritis    toe   Celiac disease    Clotting disorder (HCC)    Has a venous malformation in left  foot which causes superficial clots   Deviated septum    Gastroparesis    GERD (gastroesophageal reflux disease)    on meds   Gluten intolerance    Nephrolithiasis     PAST SURGICAL HISTORY: Past Surgical History:  Procedure Laterality Date   CHOLECYSTECTOMY     COLONOSCOPY  2019   KN-MAC-suprep(exc)-SS/TA   ear reconstructive     age [age]   MASTOIDECTOMY     age 28   POLYPECTOMY  2019   TA/SS   WISDOM TOOTH EXTRACTION      ALLERGIES: Allergies  Allergen Reactions   Estrogens     Clots    Gluten Meal      Other reaction(s): stomach upset Weight loss, stomach cramps, constipation, tingling in left arm and leg.  Weight loss, stomach cramps, constipation, tingling in left arm and leg.    FAMILY HISTORY: Family History  Problem Relation Age of Onset   Colon polyps Mother 58   Breast cancer Mother    Colitis Mother        lymphocytic colitis   Lactose intolerance Mother    Diabetes Father    Colon polyps Father 73   Parkinson's disease Father    Diabetes Brother    Uterine cancer Maternal Grandmother    Colon cancer Maternal Aunt 37   Colon polyps Maternal Aunt    Liver cancer Neg Hx    Pancreatic cancer Neg Hx    Rectal cancer Neg Hx    Esophageal cancer Neg Hx    Stomach cancer Neg Hx     SOCIAL HISTORY: Social History   Tobacco Use   Smoking status: Never   Smokeless tobacco: Never  Vaping Use   Vaping status: Never Used  Substance Use Topics   Alcohol use: Yes    Alcohol/week: 3.0 standard drinks of alcohol    Types: 3 Standard drinks or equivalent per week    Comment: 1/2 glasses weekly   Drug use: Never   Social History   Social History Narrative   Are you right handed or left handed? right   Are you currently employed ?    What is your current occupation?   Do you live at home alone?   Who lives with you?    What type of home do you live in: 1 story or 2 story?          OBJECTIVE: PHYSICAL EXAM: BP 108/73   Pulse 60   Ht 5' 4 (1.626 m)   Wt 152 lb (68.9 kg)   SpO2 100%   BMI 26.09 kg/m   General: General appearance: Awake and alert. No distress. Cooperative with exam.  Skin: No obvious rash or jaundice. HEENT: Atraumatic. Anicteric. Lungs: Non-labored breathing on room air  Heart: Regular Extremities: No edema. No obvious deformity.  Musculoskeletal: No obvious joint swelling. Psych: Affect appropriate.  Neurological: Mental Status: Alert. Speech fluent. No pseudobulbar affect Cranial Nerves: CNII: No RAPD. Visual fields grossly  intact. CNIII, IV, VI: PERRL. No nystagmus. EOMI. CN V: Facial sensation intact bilaterally to fine touch. CN VII: Facial muscles symmetric and strong. No ptosis at rest. CN VIII: Hearing grossly intact bilaterally. CN IX: No hypophonia. CN X: Palate elevates symmetrically. CN XI: Full strength shoulder shrug bilaterally. CN XII: Tongue protrusion full and midline. No atrophy or fasciculations. No significant dysarthria Motor: Tone is normal. No fasciculations in any extremities. No atrophy. No grip or percussive myotonia. Strength 5/5 in bilateral upper and lower extremities. Reflexes:  Right Left   Bicep 2+ 2+   Tricep 2+ 2+   BrRad 2+ 2+   Knee 2+ 2+   Ankle 2+ 2+    Pathological Reflexes: Hoffman: absent bilaterally Troemner: absent bilaterally Sensation: Light touch intact in all extremities Coordination: Intact finger-to- nose-finger bilaterally. Romberg negative. Gait: Able to rise from chair with arms crossed unassisted. Normal, narrow-based gait.  Lab and Test Review: External labs: 01/17/24: CMP significant for glucose 104 Ferritin 138  11/11/23: CBC w/ diff significant for MCV 97.6 Lipid panel: tChol 153, LDL 85, TG 62 HbA1c: 5.3 TSH wnl Vit D wnl Mg wnl B12: 412  Imaging/Procedures: CT head wo contrast (11/25/2006 - external): Impression:     1. No traumatic intracranial findings or skull fractures.      2. Evidence of previous right mastoid surgery with opacification of the   mastoid air cells and middle ear on the right.   CT cervical spine wo contrast (11/25/2006 - external): Impression: No cervical fractures or malalignment.   ASSESSMENT: Jorita Bohanon is a 48 y.o. female who presents for evaluation of right head muscle spasm/twitching. She has a relevant medical history of celiac disease and developmental venous malformation in foot. Her neurological examination is normal today with no abnormal movements or twitches appreciated. Available diagnostic  data is significant for essentially normal lab work including CMP, iron studies, HbA1c, TSH, vit D, Mg, and B12. Patient's symptoms appear benign with no clear neurologic deficits to suggest a more serious etiology. Symptoms could be related to a thus far unidentified electrolyte abnormality or vitamin deficiency. I will get labs to look for treatable causes. Poor sleep and stress could also contribute to symptoms, which we discussed.  PLAN: -Blood work: ionized calcium, PTH, B1, B6, MMA -Sleep and stress management discussed  -Return to clinic as needed  The impression above as well as the plan as outlined below were extensively discussed with the patient who voiced understanding. All questions were answered to their satisfaction.  When available, results of the above investigations and possible further recommendations will be communicated to the patient via telephone/MyChart. Patient to call office if not contacted after expected testing turnaround time.   Total time spent reviewing records, interview, history/exam, documentation, and coordination of care on day of encounter:  45 min   Thank you for allowing me to participate in patient's care.  If I can answer any additional questions, I would be pleased to do so.  Venetia Potters, MD   CC: Regino Slater, MD [address] [place] [address]  CC: Referring provider: Kip Righter, MD [address] [place],  [place] [address]

## 2024-02-02 ENCOUNTER — Telehealth: Payer: Self-pay | Admitting: Gastroenterology

## 2024-02-02 NOTE — Telephone Encounter (Signed)
 Patient requesting f/u call to discuss her having abdominal pain. Scheduled for next available with PA,* no sooner apt at the time*. Please advise.   Thank you

## 2024-02-02 NOTE — Telephone Encounter (Signed)
 Returned call to patient. She reports that around June 12, she started to notice upper bilateral abd pain below ribs described as stabbing. Also noted yellow diarrhea and nausea at that time. Reports she started eating better and more low fat and symptoms improved. 2 days ago she reports she ate a steak and fries and the pain has come back. No nausea or vomiting this time, however has been belching often. Reports pain is generalized to entire abdomen. I just don't feel right. Denies diarrhea, but reports what looks like mucus in her stool, however it is brown in color. Denies fevers. Patient has no gallbladder. Has not taken abts recently.

## 2024-02-03 ENCOUNTER — Other Ambulatory Visit

## 2024-02-03 ENCOUNTER — Encounter: Payer: Self-pay | Admitting: Neurology

## 2024-02-03 ENCOUNTER — Ambulatory Visit: Admitting: Neurology

## 2024-02-03 VITALS — BP 108/73 | HR 60 | Ht 64.0 in | Wt 152.0 lb

## 2024-02-03 DIAGNOSIS — R253 Fasciculation: Secondary | ICD-10-CM | POA: Diagnosis not present

## 2024-02-03 DIAGNOSIS — K900 Celiac disease: Secondary | ICD-10-CM

## 2024-02-03 DIAGNOSIS — R109 Unspecified abdominal pain: Secondary | ICD-10-CM

## 2024-02-03 NOTE — Telephone Encounter (Signed)
 Called patient and relayed provider recommendations. All questions answered.

## 2024-02-03 NOTE — Telephone Encounter (Signed)
 Please advise patient to continue blood thinner as recommended by prescribing provider.  Her appointment was moved to be seen sooner.  I will discuss her concerns and treatment plan during office visit

## 2024-02-03 NOTE — Telephone Encounter (Signed)
 Inbound call from patient stating last night she had sharp pain in her abdomen last night. States she also has a blood clot in her foot despite being on a blood thinner. Requesting a call back. Please advise, thank you

## 2024-02-03 NOTE — Patient Instructions (Addendum)
 I saw you today for muscle spasm or twitches in your head on the right. This does not appear to be anything big, bad, or scary. The most common cause of issues like this is electrolyte or vitamin abnormalities. Your prior tests have been normal, but there is a few others we should check to be sure.  I will get those labs today and be in touch when we have the results.  Proper sleep and stress management is also often helpful with muscle spasms/twitching.  Please let me know if you have any questions or concerns in the meantime.  Follow up with me as needed.  The physicians and staff at [facility] are committed to providing excellent care. You may receive a survey requesting feedback about your experience at our office. We strive to receive very good responses to the survey questions. If you feel that your experience would prevent you from giving the office a very good  response, please contact our office to try to remedy the situation. We may be reached at [phone]. Thank you for taking the time out of your busy day to complete the survey.  Venetia Potters, MD [facility]

## 2024-02-07 LAB — PARATHYROID HORMONE, INTACT (NO CA): PTH: 51 pg/mL (ref 16–77)

## 2024-02-07 LAB — METHYLMALONIC ACID, SERUM: Methylmalonic Acid, Quant: 94 nmol/L (ref 55–335)

## 2024-02-07 LAB — VITAMIN B1: Vitamin B1 (Thiamine): 7 nmol/L — ABNORMAL LOW (ref 8–30)

## 2024-02-07 LAB — CALCIUM, IONIZED: Calcium, Ion: 5 mg/dL (ref 4.7–5.5)

## 2024-02-07 LAB — VITAMIN B6: Vitamin B6: 13.1 ng/mL (ref 2.1–21.7)

## 2024-02-08 ENCOUNTER — Ambulatory Visit: Payer: Self-pay | Admitting: Neurology

## 2024-02-08 DIAGNOSIS — R748 Abnormal levels of other serum enzymes: Secondary | ICD-10-CM | POA: Diagnosis not present

## 2024-02-08 DIAGNOSIS — K900 Celiac disease: Secondary | ICD-10-CM | POA: Diagnosis not present

## 2024-02-09 ENCOUNTER — Encounter (HOSPITAL_COMMUNITY): Payer: Self-pay | Admitting: Emergency Medicine

## 2024-02-09 ENCOUNTER — Emergency Department (HOSPITAL_COMMUNITY)
Admission: EM | Admit: 2024-02-09 | Discharge: 2024-02-09 | Discharge status: Against Medical Advice | Attending: Emergency Medicine | Admitting: Emergency Medicine

## 2024-02-09 ENCOUNTER — Other Ambulatory Visit: Payer: Self-pay

## 2024-02-09 ENCOUNTER — Emergency Department (HOSPITAL_COMMUNITY)

## 2024-02-09 DIAGNOSIS — R0789 Other chest pain: Secondary | ICD-10-CM | POA: Diagnosis not present

## 2024-02-09 DIAGNOSIS — R079 Chest pain, unspecified: Secondary | ICD-10-CM | POA: Diagnosis not present

## 2024-02-09 DIAGNOSIS — Z5321 Procedure and treatment not carried out due to patient leaving prior to being seen by health care provider: Secondary | ICD-10-CM | POA: Diagnosis not present

## 2024-02-09 DIAGNOSIS — R9431 Abnormal electrocardiogram [ECG] [EKG]: Secondary | ICD-10-CM | POA: Insufficient documentation

## 2024-02-09 LAB — CBC
HCT: 41.9 % (ref 36.0–46.0)
Hemoglobin: 13.8 g/dL (ref 12.0–15.0)
MCH: 32.5 pg (ref 26.0–34.0)
MCHC: 32.9 g/dL (ref 30.0–36.0)
MCV: 98.6 fL (ref 80.0–100.0)
Platelets: 180 K/uL (ref 150–400)
RBC: 4.25 MIL/uL (ref 3.87–5.11)
RDW: 12.6 % (ref 11.5–15.5)
WBC: 6.5 K/uL (ref 4.0–10.5)
nRBC: 0 % (ref 0.0–0.2)

## 2024-02-09 LAB — BASIC METABOLIC PANEL WITH GFR
Anion gap: 10 (ref 5–15)
BUN: 11 mg/dL (ref 6–20)
CO2: 24 mmol/L (ref 22–32)
Calcium: 9.3 mg/dL (ref 8.9–10.3)
Chloride: 105 mmol/L (ref 98–111)
Creatinine, Ser: 0.72 mg/dL (ref 0.44–1.00)
GFR, Estimated: >60 mL/min (ref >60)
Glucose, Bld: 132 mg/dL — ABNORMAL HIGH (ref 70–99)
Potassium: 4.1 mmol/L (ref 3.5–5.1)
Sodium: 139 mmol/L (ref 135–145)

## 2024-02-09 LAB — HCG, SERUM, QUALITATIVE: Preg, Serum: NEGATIVE

## 2024-02-09 LAB — TROPONIN I (HIGH SENSITIVITY)
Troponin I (High Sensitivity): <2 ng/L (ref <18)
Troponin I (High Sensitivity): <2 ng/L (ref <18)

## 2024-02-09 NOTE — Progress Notes (Signed)
 I called and spoke with this patient to see how she is doing since holding xarelto.  She is continuing to hold xarelto.  Her nausea is gone but she is still having some abdominal discomfort.  She says that her GI tract has been upset for about 3 weeks now.  She has an appt to see her GI provider tomorrow.  She does have a history of celiac and IBS.  She did say that her B1 level was low which can also cause nausea.  Otherwise she is doing well.  She says that her foot is okay.  She is able to walk but is not up to playing tennis yet.

## 2024-02-09 NOTE — ED Provider Triage Note (Signed)
 Emergency Medicine Provider Triage Evaluation Note  Julie Meyer , a 48 y.o. female  was evaluated in triage.  Pt complains of chest pain. Reports pain for a few days thought it might be GERD but wasn't sure, went to her pcp and had an EKG and was told that she had flattening in V2 on her EKG and was recommended to come here for evaluation. Denies shortness of breath. Does have a vascular malformation in her left foot and was prescribed xarelto, however she stopped this medication due to GI upset.   Review of Systems  Positive:  Negative:   Physical Exam  BP 135/77   Pulse 92   Temp 97.7 F (36.5 C)   Resp 14   Ht 5' 4 (1.626 m)   Wt 68.9 kg   LMP 01/11/2024   SpO2 99%   BMI 26.09 kg/m  Gen:   Awake, no distress   Resp:  Normal effort  MSK:   Moves extremities without difficulty  Other:    Medical Decision Making  Medically screening exam initiated at 2:49 PM.  Appropriate orders placed.  Julie Meyer was informed that the remainder of the evaluation will be completed by another provider, this initial triage assessment does not replace that evaluation, and the importance of remaining in the ED until their evaluation is complete.     [name] LABOR, PA-C 02/09/24 1451

## 2024-02-09 NOTE — ED Notes (Signed)
 Patient brought to triage for repeat trop, wanted to speak to PA, patient told PA she was ready to go and PA cut off her arm band

## 2024-02-09 NOTE — ED Triage Notes (Signed)
 Pt c.o intermittent chest pain for the past few days, pt seen at her PCP and told to come here due to abnormal EKG. Pt recently stopped taking xarelto due to GI issues. Pt was on xarelto for a venous insufficiency in her foot.

## 2024-02-10 ENCOUNTER — Ambulatory Visit: Admitting: Gastroenterology

## 2024-02-10 ENCOUNTER — Encounter: Payer: Self-pay | Admitting: Gastroenterology

## 2024-02-10 VITALS — BP 92/60 | HR 75 | Ht 64.0 in | Wt 155.2 lb

## 2024-02-10 DIAGNOSIS — R110 Nausea: Secondary | ICD-10-CM

## 2024-02-10 DIAGNOSIS — Q279 Congenital malformation of peripheral vascular system, unspecified: Secondary | ICD-10-CM

## 2024-02-10 DIAGNOSIS — R1010 Upper abdominal pain, unspecified: Secondary | ICD-10-CM

## 2024-02-10 DIAGNOSIS — E519 Thiamine deficiency, unspecified: Secondary | ICD-10-CM

## 2024-02-10 DIAGNOSIS — R142 Eructation: Secondary | ICD-10-CM | POA: Diagnosis not present

## 2024-02-10 NOTE — Progress Notes (Signed)
 02/18/2024 Julie Meyer [id number] [birth year]   HISTORY OF PRESENT ILLNESS:  This is a 48 year old female with celiac disease and known GERD/esophagitis who is a patient of Dr. Trenna.  She is presenting to the office with complaints of nausea, upper abdominal pain, and belching.  She admits to being very compliant with her gluten-free diet.  We discussed checking her titers, but she does not think necessary.  She does not have a gallbladder.  We know that she has reflux by esophagitis seen on previous EGD and she is on omeprazole  20 mg daily.  Was on Xarelto for venous malformation and has now stopped that because she was wondering if that contributed to her symptoms.  Since June 12 she has been having some upper abdominal pain, sometimes over the right rib cage, but some on both sides of her upper abdomen.  Some nausea, a lot of burping.  Had some episodes of yellow diarrhea.  Symptoms may be better since discontinuing Xarelto but that was just a couple of days ago.  Found to have a low thiamine level so that is being treated by her PCP.  Vitamin B12 and vitamin B6 are normal.  Iron studies normal as well.  Vitamin D normal.  Lipase 49.  Other basic labs unremarkable.  Colonoscopy 11/2022:  - One 5 mm polyp in the ascending colon, removed with a cold snare. Resected and retrieved. - Non- bleeding internal hemorrhoids. - The GI Genius ( intelligent endoscopy module) , computer- aided polyp detection system powered by AI was utilized to detect colorectal polyps through enhanced visualization during colonoscopy.  Polyp was a tubular adenoma.  EGD 10/2020: - LA Grade B reflux esophagitis with no bleeding. - Gastroesophageal flap valve classified as Hill Grade II ( fold present, opens with respiration) . - Normal stomach. - Normal first portion of the duodenum and second portion of the duodenum. Biopsied.  Surgical [P], duodenal bulb, 2nd portion of duodenum and distal duodenum - DUODENAL  MUCOSA WITH NO SPECIFIC HISTOPATHOLOGIC CHANGES - NEGATIVE FOR INCREASED INTRAEPITHELIAL LYMPHOCYTES OR VILLOUS ARCHITECTURAL CHANGES   Past Medical History:  Diagnosis Date   Arthritis    toe   Celiac disease    Clotting disorder (HCC)    Has a venous malformation in left foot which causes superficial clots   Deviated septum    Gastroparesis    GERD (gastroesophageal reflux disease)    on meds   Gluten intolerance    Nephrolithiasis    Past Surgical History:  Procedure Laterality Date   CHOLECYSTECTOMY     COLONOSCOPY  2019   KN-MAC-suprep(exc)-SS/TA   ear reconstructive     age [age]   MASTOIDECTOMY     age 3   POLYPECTOMY  2019   TA/SS   WISDOM TOOTH EXTRACTION      reports that she has never smoked. She has never used smokeless tobacco. She reports current alcohol use of about 3.0 standard drinks of alcohol per week. She reports that she does not use drugs. family history includes Breast cancer in her mother; Colitis in her mother; Colon cancer (age of onset: 62) in her maternal aunt; Colon polyps in her maternal aunt; Colon polyps (age of onset: 4) in her mother; Colon polyps (age of onset: 75) in her father; Diabetes in her brother and father; Lactose intolerance in her mother; Parkinson's disease in her father; Uterine cancer in her maternal grandmother. Allergies  Allergen Reactions   Estrogens     Clots  Gluten Meal     Other reaction(s): stomach upset Weight loss, stomach cramps, constipation, tingling in left arm and leg.  Weight loss, stomach cramps, constipation, tingling in left arm and leg.      Outpatient Encounter Medications as of 02/10/2024  Medication Sig   Acetaminophen (TYLENOL 8 HOUR PO) Tylenol 500 mg tablet  Take 1 tablet as needed by oral route.   aspirin EC 81 MG tablet Take 81 mg by mouth once a week. Swallow whole.   DIGESTIVE ENZYMES PO Take 1 tablet by mouth daily. Has lactaid in it   Elastic Bandages & Supports (RELIEF KNEE) MISC For  the foot   fluticasone (FLONASE ALLERGY RELIEF) 50 MCG/ACT nasal spray Place 1 spray into both nostrils as needed.   levonorgestrel  (MIRENA ) 20 MCG/24HR IUD by Intrauterine route.   melatonin 3 MG TABS tablet Take 3 mg by mouth at bedtime as needed.   omeprazole  (PRILOSEC) 20 MG capsule Take 20 mg by mouth daily.   Peppermint Oil 90 MG CPCR Take 2 capsules by mouth daily as needed.   [DISCONTINUED] rivaroxaban (XARELTO) 10 MG TABS tablet Take 2.5 mg by mouth daily. (Patient not taking: Reported on 02/10/2024)   No facility-administered encounter medications on file as of 02/10/2024.    REVIEW OF SYSTEMS  : All other systems reviewed and negative except where noted in the History of Present Illness.   PHYSICAL EXAM: BP 92/60   Pulse 75   Ht 5' 4 (1.626 m)   Wt 155 lb 3.2 oz (70.4 kg)   LMP 01/11/2024   BMI 26.64 kg/m  General: Well developed white female in no acute distress Head: Normocephalic and atraumatic Eyes:  Sclerae anicteric, conjunctiva pink. Ears: Normal auditory acuity Lungs: Clear throughout to auscultation; no W/R/R. Heart: Regular rate and rhythm; no M/R/G. Abdomen: Soft, non-distended.  BS present.  Non-tender. Musculoskeletal: Symmetrical with no gross deformities  Skin: No lesions on visible extremities Extremities: No edema  Neurological: Alert oriented x 4, grossly non-focal Psychological:  Alert and cooperative. Normal mood and affect  ASSESSMENT AND PLAN: *48 year old female with celiac disease and known GERD/esophagitis presenting with complaints of nausea, upper abdominal pain, and belching.  She admits to being very compliant with her gluten-free diet.  We discussed checking her titers, but she does not think necessary.  She does not have a gallbladder.  We know that she has reflux by esophagitis seen on previous EGD and she is on omeprazole  20 mg daily.  Was on Xarelto for venous malformation and has now stopped that because she was wondering if that  contributed to her symptoms. *Thiamine deficiency: Vitamin B12 and vitamin B6 are normal.  Iron studies normal as well.  Vitamin D normal.  This is being treated by her PCP. *Venous malformation in her foot which causes superficial clots: Needs some type of blood thinner.  Was on Xarelto, but thought maybe that was contributing to her GI upset.  -She was given samples of FDgard.  She will try these. - Consider adding Famotidine 20 mg at bedtime. - I have asked her to call or send a MyChart message with an update.  Other considerations of a SIBO breath test.  She also mention H. pylori testing so we can do a Diatherix for that.   CC:  Koirala, Dibas, MD

## 2024-02-10 NOTE — Patient Instructions (Addendum)
 FDgard samples.   Consider adding Famotidine at bedtime.   Call or send Mychart message if no continued improvement.  _______________________________________________________  If your blood pressure at your visit was 140/90 or greater, please contact your primary care physician to follow up on this.  _______________________________________________________  If you are age 46 or older, your body mass index should be between 23-30. Your Body mass index is 26.64 kg/m. If this is out of the aforementioned range listed, please consider follow up with your Primary Care Provider.  If you are age 10 or younger, your body mass index should be between 19-25. Your Body mass index is 26.64 kg/m. If this is out of the aformentioned range listed, please consider follow up with your Primary Care Provider.   ________________________________________________________  The [facility] providers would like to encourage you to use MYCHART to communicate with providers for non-urgent requests or questions.  Due to long hold times on the telephone, sending your provider a message by [facility] may be a faster and more efficient way to get a response.  Please allow 48 business hours for a response.  Please remember that this is for non-urgent requests.  _______________________________________________________

## 2024-02-11 DIAGNOSIS — R079 Chest pain, unspecified: Secondary | ICD-10-CM | POA: Diagnosis not present

## 2024-02-11 DIAGNOSIS — G4700 Insomnia, unspecified: Secondary | ICD-10-CM | POA: Diagnosis not present

## 2024-02-11 DIAGNOSIS — R110 Nausea: Secondary | ICD-10-CM | POA: Diagnosis not present

## 2024-02-18 ENCOUNTER — Telehealth: Payer: Self-pay | Admitting: Gastroenterology

## 2024-02-18 ENCOUNTER — Encounter: Payer: Self-pay | Admitting: Gastroenterology

## 2024-02-18 DIAGNOSIS — R110 Nausea: Secondary | ICD-10-CM | POA: Insufficient documentation

## 2024-02-18 DIAGNOSIS — A048 Other specified bacterial intestinal infections: Secondary | ICD-10-CM

## 2024-02-18 DIAGNOSIS — R1010 Upper abdominal pain, unspecified: Secondary | ICD-10-CM | POA: Insufficient documentation

## 2024-02-18 DIAGNOSIS — R142 Eructation: Secondary | ICD-10-CM | POA: Insufficient documentation

## 2024-02-18 NOTE — Telephone Encounter (Signed)
 Julie Meyer see message from pt regarding SIBO and H pylori testing

## 2024-02-18 NOTE — Telephone Encounter (Signed)
 Inbound call from patient stating she was last seen in office with Julie Meyer on 02/10/24 and would like to know if she can take a SIBO test and a H Pylori test. Patient is requesting a call back   Please advise  Thank you

## 2024-02-22 NOTE — Telephone Encounter (Signed)
 SIBO breath test kit left at front desk on 2ND floor for pick up- order has been faxed   Diatherix kit left at front desk on 2 ND floor for pick up.  The patient has been notified of this information and all questions answered.

## 2024-03-09 DIAGNOSIS — R1010 Upper abdominal pain, unspecified: Secondary | ICD-10-CM | POA: Diagnosis not present

## 2024-03-09 DIAGNOSIS — R142 Eructation: Secondary | ICD-10-CM | POA: Diagnosis not present

## 2024-03-09 DIAGNOSIS — R110 Nausea: Secondary | ICD-10-CM | POA: Diagnosis not present

## 2024-03-14 ENCOUNTER — Telehealth: Payer: Self-pay | Admitting: Gastroenterology

## 2024-03-14 ENCOUNTER — Ambulatory Visit: Admitting: Gastroenterology

## 2024-03-14 NOTE — Telephone Encounter (Signed)
 Harlene please see report for SIBO and possible positive

## 2024-03-14 NOTE — Telephone Encounter (Signed)
 Inbound call from Julie Meyer at [employer] stating he has faxed over SIBO breathe test result report. States it is negative but there are 2 highlighted footnotes that may make the test considered positive. Call back number is [phone]. Please advise, thank you

## 2024-03-16 NOTE — Telephone Encounter (Signed)
 Julie Meyer did you see the note regarding the SIBO results

## 2024-03-16 NOTE — Telephone Encounter (Signed)
 Heather have you seen results for SIBO?

## 2024-03-16 NOTE — Telephone Encounter (Signed)
 SIBO on [place] desk.

## 2024-03-18 NOTE — Telephone Encounter (Signed)
 Arley return your call.

## 2024-03-18 NOTE — Telephone Encounter (Signed)
 Left message for Arley with [facility] to call office.

## 2024-03-22 MED ORDER — RIFAXIMIN 550 MG PO TABS: 550.0000 mg | ORAL_TABLET | Freq: Three times a day (TID) | ORAL | 0 refills | Status: AC

## 2024-03-22 NOTE — Addendum Note (Signed)
 Addended by: WILL POWELL CROME on: 03/22/2024 12:08 PM   Modules accepted: Orders

## 2024-03-22 NOTE — Telephone Encounter (Signed)
Patient informed of results and script sent to pharmacy.

## 2024-03-23 ENCOUNTER — Encounter: Payer: Self-pay | Admitting: Gastroenterology

## 2024-03-25 ENCOUNTER — Telehealth: Payer: Self-pay

## 2024-03-25 ENCOUNTER — Telehealth: Payer: Self-pay | Admitting: *Deleted

## 2024-03-25 ENCOUNTER — Other Ambulatory Visit (HOSPITAL_COMMUNITY): Payer: Self-pay

## 2024-03-25 DIAGNOSIS — Z1231 Encounter for screening mammogram for malignant neoplasm of breast: Secondary | ICD-10-CM | POA: Diagnosis not present

## 2024-03-25 DIAGNOSIS — Z1331 Encounter for screening for depression: Secondary | ICD-10-CM | POA: Diagnosis not present

## 2024-03-25 DIAGNOSIS — Z01419 Encounter for gynecological examination (general) (routine) without abnormal findings: Secondary | ICD-10-CM | POA: Diagnosis not present

## 2024-03-25 NOTE — Telephone Encounter (Signed)
 PT is calling to inform that the pharmacy has not filled the Xifaxin. She stated that they said we did something wrong on the prescription and we need to contact them. Please advise

## 2024-03-25 NOTE — Telephone Encounter (Signed)
 Per patient's pharmacy Xifaxan  needs prior authorization. Request sent to PA team.

## 2024-03-25 NOTE — Telephone Encounter (Signed)
 Please submit PA for Xifaxan . DX. IBS-D

## 2024-03-25 NOTE — Telephone Encounter (Signed)
 Pharmacy Patient Advocate Encounter   Received notification from Pt Calls Messages that prior authorization for Xifaxan  550MG  tablets is required/requested.   Insurance verification completed.   The patient is insured through [facility] .   Per test claim: PA required; PA started via CoverMyMeds. KEY [id number] . Waiting for clinical questions to populate.

## 2024-03-28 NOTE — Telephone Encounter (Signed)
 Pharmacy Patient Advocate Encounter  Per test claim: PA required; PA submitted to above mentioned insurance via Latent Key/confirmation #/EOC [id number]  Status is pending

## 2024-03-28 NOTE — Telephone Encounter (Signed)
 Patient informed insurance approval is still pending.

## 2024-03-28 NOTE — Telephone Encounter (Signed)
 Patient calling in regards to  pa. Please advise.

## 2024-03-29 DIAGNOSIS — Q279 Congenital malformation of peripheral vascular system, unspecified: Secondary | ICD-10-CM | POA: Diagnosis not present

## 2024-03-29 NOTE — Telephone Encounter (Signed)
 Xifaxan  is approved.

## 2024-03-30 NOTE — Telephone Encounter (Signed)
 Pharmacy Patient Advocate Encounter  Received notification from [facility] that Prior Authorization for  Xifaxan  550MG  tablets has been APPROVED from 03-28-2024 to 04-11-2024   PA #/Case ID/Reference #: [id number]

## 2024-03-31 NOTE — Telephone Encounter (Signed)
**Note De-identified  [place] Obfuscation** Please advise 

## 2024-03-31 NOTE — Telephone Encounter (Signed)
 Patient requesting alternate medication for xifaxen due to cost. Please advise.

## 2024-04-01 NOTE — Telephone Encounter (Signed)
 Yes even with approval cost for Xifaxan  is $500 with patient's insurance.

## 2024-04-01 NOTE — Telephone Encounter (Signed)
 Patient informed and agreed to wait until samples are available for Xifaxan . Patient would like to know if her being on a PPI for several years now could be why she has SIBO? Please advise.

## 2024-04-26 NOTE — Telephone Encounter (Signed)
 Thank you I let the pt know she can pick it up

## 2024-04-26 NOTE — Telephone Encounter (Signed)
 Powell do you have the SIBO results. The pt wants a copy.  I am not sure where to find a copy

## 2024-04-26 NOTE — Telephone Encounter (Signed)
 I got a copy of the SIBO test. I can place a copy for her up front.

## 2024-05-04 ENCOUNTER — Encounter: Payer: Self-pay | Admitting: *Deleted

## 2024-05-06 DIAGNOSIS — L609 Nail disorder, unspecified: Secondary | ICD-10-CM | POA: Diagnosis not present

## 2024-05-06 DIAGNOSIS — K900 Celiac disease: Secondary | ICD-10-CM | POA: Diagnosis not present

## 2024-05-06 DIAGNOSIS — E559 Vitamin D deficiency, unspecified: Secondary | ICD-10-CM | POA: Diagnosis not present

## 2024-05-06 DIAGNOSIS — T7840XA Allergy, unspecified, initial encounter: Secondary | ICD-10-CM | POA: Diagnosis not present

## 2024-05-06 DIAGNOSIS — Z23 Encounter for immunization: Secondary | ICD-10-CM | POA: Diagnosis not present

## 2024-05-06 DIAGNOSIS — R110 Nausea: Secondary | ICD-10-CM | POA: Diagnosis not present

## 2024-05-06 DIAGNOSIS — R61 Generalized hyperhidrosis: Secondary | ICD-10-CM | POA: Diagnosis not present

## 2024-05-06 DIAGNOSIS — K219 Gastro-esophageal reflux disease without esophagitis: Secondary | ICD-10-CM | POA: Diagnosis not present

## 2024-05-09 DIAGNOSIS — R61 Generalized hyperhidrosis: Secondary | ICD-10-CM | POA: Diagnosis not present

## 2024-05-09 DIAGNOSIS — R110 Nausea: Secondary | ICD-10-CM | POA: Diagnosis not present

## 2024-05-09 DIAGNOSIS — K900 Celiac disease: Secondary | ICD-10-CM | POA: Diagnosis not present

## 2024-05-09 DIAGNOSIS — E559 Vitamin D deficiency, unspecified: Secondary | ICD-10-CM | POA: Diagnosis not present

## 2024-06-14 ENCOUNTER — Encounter: Payer: Self-pay | Admitting: Gastroenterology

## 2024-06-14 ENCOUNTER — Ambulatory Visit: Admitting: Gastroenterology

## 2024-06-14 VITALS — BP 110/68 | HR 72 | Ht 64.0 in | Wt 160.0 lb

## 2024-06-14 DIAGNOSIS — R1011 Right upper quadrant pain: Secondary | ICD-10-CM | POA: Diagnosis not present

## 2024-06-14 DIAGNOSIS — K589 Irritable bowel syndrome without diarrhea: Secondary | ICD-10-CM

## 2024-06-14 DIAGNOSIS — K2100 Gastro-esophageal reflux disease with esophagitis, without bleeding: Secondary | ICD-10-CM

## 2024-06-14 DIAGNOSIS — K900 Celiac disease: Secondary | ICD-10-CM | POA: Diagnosis not present

## 2024-06-14 NOTE — Patient Instructions (Addendum)
 VISIT SUMMARY:  Today, we addressed your gastrointestinal symptoms, concerns about vitamin deficiencies, and other health issues related to your celiac disease and venous malformation. We discussed your current medications and made some adjustments to better manage your symptoms and overall health.  YOUR PLAN:  CELIAC DISEASE: You may have had accidental gluten exposure, which could be causing some of your symptoms. - celiac antibody test to check for gluten exposure. -Consider schedule an upper endoscopy to assess your intestinal lining.  VENOUS MALFORMATION WITH RISK OF THROMBOSIS: You have a venous malformation that increases your risk of blood clots, especially around medical procedures. -We will switch your anticoagulant medication to Eliquis, which has a slightly lower risk of causing gastrointestinal bleeding compared to Xarelto.  GASTROESOPHAGEAL REFLUX DISEASE (GERD) WITH HISTORY OF REFLUX ESOPHAGITIS: You have long-standing GERD, which is currently managed with a low dose of omeprazole . Aspirin has been making your reflux symptoms worse. -Continue taking your low dose of omeprazole . -Switch to Eliquis to help reduce your reflux symptoms.  RIGHT UPPER QUADRANT ABDOMINAL PAIN, INTERMITTENT: You have intermittent pain in your right upper abdomen, which could be related to gastritis, ulcers, or fatty liver. -Consider right upper quadrant ultrasound to check your liver and bile ducts. Call office at [phone], if you should decide that you would like to schedule.   SUSPECTED FATTY LIVER: You might have fatty liver, which could be causing your abdominal pain and is related to your elevated glucose levels. - Consider right upper quadrant ultrasound to assess your liver.  THIAMINE  (VITAMIN B1) DEFICIENCY, MILD: You have a mild thiamine  deficiency, likely due to celiac disease and dietary factors. -Continue taking 100 mg of thiamine  daily. -Consider taking a B complex supplement for  additional B vitamins.  ROSACEA: Your rosacea may be worsening, possibly related to your celiac disease. -We will continue to monitor your rosacea symptoms.  POSSIBLE SMALL INTESTINAL BACTERIAL OVERGROWTH (SIBO): You might have SIBO, which could be causing your intermittent abdominal pain. -Consider taking Xifaxan  if your symptoms get worse. -Monitor your symptoms and we will reassess if needed.  _______________________________________________________  If your blood pressure at your visit was 140/90 or greater, please contact your primary care physician to follow up on this.  _______________________________________________________  If you are age 52 or older, your body mass index should be between 23-30. Your Body mass index is 27.46 kg/m. If this is out of the aforementioned range listed, please consider follow up with your Primary Care Provider.  If you are age 51 or younger, your body mass index should be between 19-25. Your Body mass index is 27.46 kg/m. If this is out of the aformentioned range listed, please consider follow up with your Primary Care Provider.   ________________________________________________________  The [facility] providers would like to encourage you to use MYCHART to communicate with providers for non-urgent requests or questions.  Due to long hold times on the telephone, sending your provider a message by [facility] may be a faster and more efficient way to get a response.  Please allow 48 business hours for a response.  Please remember that this is for non-urgent requests.  _______________________________________________________  [facility] is using a team-based approach to care.  Your team is made up of your doctor and two to three APPS. Our APPS (Nurse Practitioners and Physician Assistants) work with your physician to ensure care continuity for you. They are fully qualified to address your health concerns and develop a treatment plan. They  communicate directly with your gastroenterologist  to care for you. Seeing the Advanced Practice Practitioners on your physician's team can help you by facilitating care more promptly, often allowing for earlier appointments, access to diagnostic testing, procedures, and other specialty referrals.   Due to recent changes in healthcare laws, you may see the results of your imaging and laboratory studies on MyChart before your provider has had a chance to review them.  We understand that in some cases there may be results that are confusing or concerning to you. Not all laboratory results come back in the same time frame and the provider may be waiting for multiple results in order to interpret others.  Please give us  48 hours in order for your provider to thoroughly review all the results before contacting the office for clarification of your results.   Thank you for choosing me and [facility].  Dr Kavitha Nandigam

## 2024-06-14 NOTE — Progress Notes (Signed)
 Julie Meyer    [id number]    07/24/1976  Primary Care Physician:Morrow, Beverley, MD  Referring Physician: Kip Beverley, MD [address] [place],  [place] [address]   Chief complaint:  IBS  Discussed the use of AI scribe software for clinical note transcription with the patient, who gave verbal consent to proceed.  History of Present Illness Julie Meyer is a 47 year old female with celiac disease and venous malformation who presents with gastrointestinal symptoms and concerns about vitamin deficiencies.  Gastrointestinal symptoms - Intermittent right upper quadrant abdominal pain - Yellow stools - Bloating and abdominal pain without associated brain fog - Occasional lower right abdominal pain - History of reflux esophagitis with chest pain and reflux symptoms, especially when taking aspirin - No current brain fog  Malabsorption and nutritional concerns - History of celiac disease with strict adherence to a gluten-free diet - Concern for malabsorption, particularly B vitamins - Takes a multivitamin with 125% daily B12 and 7% daily B1 - Supplements with 100 mg B1 daily but sometimes forgets doses - Low B1 levels previously identified, with improvement in symptoms after supplementation - Celiac antibodies last checked in 2022  Pancreatic enzyme abnormalities - Slightly elevated pancreatic enzymes on initial blood test, with normalization on follow-up testing  Medication-related gastrointestinal effects - Aspirin use for venous malformation flares, causing acid reflux and stomach pain - Omeprazole  use for 14 years, with dose increased to 40 mg for chest pain and reflux, especially when taking aspirin - Attempts to discontinue omeprazole  have been unsuccessful due to symptom recurrence - Tried Xarelto at low dose, resulting in muscle spasms in head and eye, and sleep disturbances; discontinued use - Considering switch to Eliquis  Small intestinal  bacterial overgrowth (sibo) evaluation - SIBO testing was equivocal - Did not take prescribed Xifaxan   Post-cholecystectomy status - Cholecystectomy in 2018 for abdominal pain, later attributed to undiagnosed celiac disease  Intussusception history - Intussusception in 2018, resolved spontaneously  Venous malformation - History of venous malformation with flares related to hormonal fluctuations  Glycemic control - Elevated glucose levels with normal HbA1c  Physical activity - Active lifestyle, regularly plays tennis - Concerned about maintaining health to continue sports participation   Colonoscopy 11/17/22 - One 5 mm polyp in the ascending colon, removed with a cold snare. Resected and retrieved. - Non- bleeding internal hemorrhoids. Surgical [P], colon, ascending, polyp (1) TUBULAR ADENOMA. NEGATIVE FOR HIGH-GRADE DYSPLASIA.  Colonoscopy March 12, 2018: 9 mm polyp [tubular adenoma] removed from ascending colon and 1 mm polyp [sessile serrated adenoma] from transverse colon, internal hemorrhoids otherwise normal exam   Glucose breath test: May 30, 2018 did not show any increase in hydrogen or methane level to suggest small intestinal bacterial overgrowth   EGD 10/22/20 - LA Grade B (one or more mucosal breaks greater than 5 mm, not extending between the tops of two mucosal folds) esophagitis with no bleeding was found 34 to 37 cm from the incisors. - The gastroesophageal flap valve was visualized endoscopically and classified as Hill Grade II (fold present, opens with respiration). - The stomach was normal. - The first portion of the duodenum and second portion of the duodenum were normal. Biopsies for histology were taken with a cold forceps for evaluation of celiac disease.  Outpatient Encounter Medications as of 06/14/2024  Medication Sig   Acetaminophen (TYLENOL 8 HOUR PO) Tylenol 500 mg tablet  Take 1 tablet as needed by  oral route.   aspirin EC 81 MG tablet Take  81 mg by mouth once a week. Swallow whole.   DIGESTIVE ENZYMES PO Take 1 tablet by mouth daily. Has lactaid in it   Elastic Bandages & Supports (RELIEF KNEE) MISC For the foot   fluticasone (FLONASE ALLERGY RELIEF) 50 MCG/ACT nasal spray Place 1 spray into both nostrils as needed.   levonorgestrel  (MIRENA ) 20 MCG/24HR IUD by Intrauterine route.   melatonin 3 MG TABS tablet Take 3 mg by mouth at bedtime as needed.   omeprazole  (PRILOSEC) 20 MG capsule Take 20 mg by mouth daily.   Peppermint Oil 90 MG CPCR Take 2 capsules by mouth daily as needed.   rifaximin  (XIFAXAN ) 550 MG TABS tablet Take 1 tablet (550 mg total) by mouth 3 (three) times daily.   No facility-administered encounter medications on file as of 06/14/2024.    Allergies as of 06/14/2024 - Review Complete 02/18/2024  Allergen Reaction Noted   Estrogens  02/26/2018   Gluten meal  06/17/2018    Past Medical History:  Diagnosis Date   Arthritis    toe   Celiac disease    Clotting disorder    Has a venous malformation in left foot which causes superficial clots   Deviated septum    Gastroparesis    GERD (gastroesophageal reflux disease)    on meds   Gluten intolerance    Nephrolithiasis     Past Surgical History:  Procedure Laterality Date   CHOLECYSTECTOMY     COLONOSCOPY  2019   KN-MAC-suprep(exc)-SS/TA   ear reconstructive     age [age]   MASTOIDECTOMY     age 45   POLYPECTOMY  2019   TA/SS   WISDOM TOOTH EXTRACTION      Family History  Problem Relation Age of Onset   Colon polyps Mother 59   Breast cancer Mother    Colitis Mother        lymphocytic colitis   Lactose intolerance Mother    Diabetes Father    Colon polyps Father 88   Parkinson's disease Father    Diabetes Brother    Uterine cancer Maternal Grandmother    Colon cancer Maternal Aunt 37   Colon polyps Maternal Aunt    Liver cancer Neg Hx    Pancreatic cancer Neg Hx    Rectal cancer Neg Hx    Esophageal cancer Neg Hx    Stomach  cancer Neg Hx     Social History   Socioeconomic History   Marital status: Married    Spouse name: Not on file   Number of children: 2   Years of education: Not on file   Highest education level: Not on file  Occupational History   Occupation: Runner, Broadcasting/film/video  Tobacco Use   Smoking status: Never   Smokeless tobacco: Never  Vaping Use   Vaping status: Never Used  Substance and Sexual Activity   Alcohol use: Yes    Alcohol/week: 3.0 standard drinks of alcohol    Types: 3 Standard drinks or equivalent per week    Comment: 1/2 glasses weekly   Drug use: Never   Sexual activity: Yes    Birth control/protection: I.U.D.  Other Topics Concern   Not on file  Social History Narrative   Are you right handed or left handed? right   Are you currently employed ?    What is your current occupation?   Do you live at home alone?   Who lives with you?  What type of home do you live in: 1 story or 2 story?        Social Drivers of Corporate Investment Banker Strain: Not on file  Food Insecurity: Not on file  Transportation Needs: Not on file  Physical Activity: Not on file  Stress: Not on file  Social Connections: Not on file  Intimate Partner Violence: Not on file      Review of systems: All other review of systems negative except as mentioned in the HPI.   Physical Exam: Vitals:   06/14/24 0845  BP: 110/68  Pulse: 72   Body mass index is 27.46 kg/m. Gen:      No acute distress HEENT:  sclera anicteric CV: s1s2 rrr, no murmur Lungs: B/l clear. Abd:      soft, non-tender; no palpable masses, no distension Ext:    No edema Neuro: alert and oriented x 3 Psych: normal mood and affect  Data Reviewed:  Reviewed labs, radiology imaging, old records and pertinent past GI work up  Assessment & Plan Celiac disease Potential accidental gluten exposure. Symptoms include rosacea and possible malabsorption. Last celiac antibody test in 2022 was slightly elevated. No recent  liver enzyme abnormalities. - Order celiac antibody test - Will schedule upper endoscopy to assess intestinal lining, patient wants to hold off scheduling it now, she is in the midst of changing insurance.  She will call us  back once she is ready to schedule it for  Venous malformation with risk of thrombosis Venous malformation with increased clotting risk, especially periprocedural. Previous Xarelto use discontinued due to side effects. Considering Eliquis as an alternative anticoagulant. Eliquis has a slightly lower risk of GI bleeding compared to Xarelto, but both are comparable in efficacy. - Switch to Eliquis for anticoagulation  Gastroesophageal reflux disease with history of reflux esophagitis Long-standing GERD with reflux esophagitis. Currently on a low dose of omeprazole . Previous increase to 40 mg caused chest pain. Aspirin exacerbates reflux symptoms. Eliquis may reduce reflux symptoms compared to aspirin. - Continue low dose omeprazole  20 mg daily and antireflux measures   Right upper quadrant abdominal pain, intermittent Intermittent right upper quadrant pain with previous elevated pancreatic enzymes, now resolved. Differential includes referred pain from gastritis or ulcers, and possible fatty liver. Previous gallbladder removal in 2018. No current liver enzyme abnormalities. - Plan for right upper quadrant ultrasound to assess liver and bile ducts  Suspected fatty liver Due to intermittent right upper quadrant pain and elevated glucose levels. No current liver enzyme abnormalities. Potential risk factors include elevated glucose and stress-related metabolic changes. - right upper quadrant ultrasound to assess liver  Thiamine  (vitamin B1) deficiency, mild Mild thiamine  deficiency likely due to celiac disease and dietary factors. Currently supplementing with 100 mg of thiamine  daily. B12 levels are low normal, possibly due to celiac disease. - Continue thiamine   supplementation - Consider B complex supplementation for additional B vitamins  Rosacea Worsening rosacea, possibly related to celiac disease. - Continue to monitor rosacea symptoms  Possible small intestinal bacterial overgrowth (SIBO) Possible SIBO with equivocal test results. Symptoms include intermittent abdominal pain. Previous purchase of Xifaxan  but not taken due to symptom improvement. Discussed the risks and benefits of taking Xifaxan , including potential gut flora disruption. - Consider taking Xifaxan  if symptoms worsen - Monitor symptoms and reassess need for treatment     This visit required >40 minutes of patient care (this includes precharting, chart review, review of results, face-to-face time used for counseling as well as  treatment plan and follow-up. The patient was provided an opportunity to ask questions and all were answered. The patient agreed with the plan and demonstrated an understanding of the instructions.  LOIS Wilkie Mcgee , MD    CC: Kip Righter, MD

## 2024-06-19 NOTE — Progress Notes (Deleted)
  Cardiology Office Note:  .   Date:  06/19/2024  ID:  Julie Meyer, DOB Dec 31, 1975, MRN [id number] PCP: Kip Righter, MD  [facility] Providers Cardiologist:  None { Click to update primary MD,subspecialty MD or APP then REFRESH:1}   History of Present Illness: .   No chief complaint on file.   Julie Meyer is a 48 y.o. female with history of GERD who presents for the evaluation of chest pain at the request of Kip Righter, MD.    ***{There is no content from the last Narrative History section.}    ROS: All other ROS reviewed and negative. Pertinent positives noted in the HPI.     Studies Reviewed: SABRA       Physical Exam:   VS:  LMP 06/14/2024 (Exact Date)    Wt Readings from Last 3 Encounters:  06/14/24 160 lb (72.6 kg)  02/10/24 155 lb 3.2 oz (70.4 kg)  02/09/24 152 lb (68.9 kg)    GEN: Well nourished, well developed in no acute distress NECK: No JVD; No carotid bruits CARDIAC: ***RRR, no murmurs, rubs, gallops RESPIRATORY:  Clear to auscultation without rales, wheezing or rhonchi  ABDOMEN: Soft, non-tender, non-distended EXTREMITIES:  No edema; No deformity  ASSESSMENT AND PLAN: .   ***    {Are you ordering a CV Procedure (e.g. stress test, cath, DCCV, TEE, etc)?   Press F2        :[id number]}   Follow-up: No follow-ups on file.  Signed, Darryle DASEN. Barbaraann, MD, [facility]  [facility]  [address] [place], [place] [address] [phone]  7:11 PM

## 2024-06-20 ENCOUNTER — Ambulatory Visit: Attending: Cardiovascular Disease | Admitting: Cardiovascular Disease

## 2024-06-20 DIAGNOSIS — R072 Precordial pain: Secondary | ICD-10-CM
